# Patient Record
Sex: Female | Born: 1935 | Race: Black or African American | Hispanic: No | Smoking: Never smoker
Health system: Southern US, Community
[De-identification: ages and names within clinical notes are randomized; demographics above are authoritative.]

## PROBLEM LIST (undated history)

## (undated) DIAGNOSIS — IMO0002 Reserved for concepts with insufficient information to code with codable children: Secondary | ICD-10-CM

## (undated) DIAGNOSIS — I10 Essential (primary) hypertension: Secondary | ICD-10-CM

## (undated) DIAGNOSIS — M1990 Unspecified osteoarthritis, unspecified site: Secondary | ICD-10-CM

## (undated) DIAGNOSIS — M329 Systemic lupus erythematosus, unspecified: Secondary | ICD-10-CM

## (undated) DIAGNOSIS — M810 Age-related osteoporosis without current pathological fracture: Secondary | ICD-10-CM

## (undated) DIAGNOSIS — Z5189 Encounter for other specified aftercare: Secondary | ICD-10-CM

## (undated) DIAGNOSIS — F0390 Unspecified dementia without behavioral disturbance: Secondary | ICD-10-CM

## (undated) HISTORY — PX: VASCULAR SURGERY: SHX849

## (undated) HISTORY — PX: BACK SURGERY: SHX140

---

## 2002-04-30 ENCOUNTER — Encounter: Payer: Self-pay | Admitting: Unknown Physician Specialty

## 2002-04-30 ENCOUNTER — Ambulatory Visit (HOSPITAL_COMMUNITY): Admission: RE | Admit: 2002-04-30 | Discharge: 2002-04-30 | Payer: Self-pay | Admitting: Unknown Physician Specialty

## 2002-06-25 ENCOUNTER — Ambulatory Visit (HOSPITAL_COMMUNITY): Admission: RE | Admit: 2002-06-25 | Discharge: 2002-06-25 | Payer: Self-pay | Admitting: Unknown Physician Specialty

## 2002-06-25 ENCOUNTER — Encounter: Payer: Self-pay | Admitting: Unknown Physician Specialty

## 2002-08-30 ENCOUNTER — Encounter: Payer: Self-pay | Admitting: Unknown Physician Specialty

## 2002-08-30 ENCOUNTER — Ambulatory Visit (HOSPITAL_COMMUNITY): Admission: RE | Admit: 2002-08-30 | Discharge: 2002-08-30 | Payer: Self-pay | Admitting: Unknown Physician Specialty

## 2002-09-07 ENCOUNTER — Encounter (HOSPITAL_BASED_OUTPATIENT_CLINIC_OR_DEPARTMENT_OTHER): Admission: RE | Admit: 2002-09-07 | Discharge: 2002-12-06 | Payer: Self-pay | Admitting: Internal Medicine

## 2002-11-17 ENCOUNTER — Encounter: Payer: Self-pay | Admitting: Unknown Physician Specialty

## 2002-11-17 ENCOUNTER — Encounter: Admission: RE | Admit: 2002-11-17 | Discharge: 2002-11-17 | Payer: Self-pay | Admitting: Unknown Physician Specialty

## 2003-01-05 ENCOUNTER — Emergency Department (HOSPITAL_COMMUNITY): Admission: EM | Admit: 2003-01-05 | Discharge: 2003-01-05 | Payer: Self-pay | Admitting: *Deleted

## 2003-05-03 ENCOUNTER — Ambulatory Visit (HOSPITAL_COMMUNITY): Admission: RE | Admit: 2003-05-03 | Discharge: 2003-05-03 | Payer: Self-pay | Admitting: Unknown Physician Specialty

## 2003-05-03 ENCOUNTER — Encounter: Payer: Self-pay | Admitting: Unknown Physician Specialty

## 2003-05-18 ENCOUNTER — Ambulatory Visit (HOSPITAL_COMMUNITY): Admission: RE | Admit: 2003-05-18 | Discharge: 2003-05-18 | Payer: Self-pay | Admitting: Unknown Physician Specialty

## 2003-05-18 ENCOUNTER — Encounter: Payer: Self-pay | Admitting: Unknown Physician Specialty

## 2003-06-01 ENCOUNTER — Ambulatory Visit (HOSPITAL_COMMUNITY): Admission: RE | Admit: 2003-06-01 | Discharge: 2003-06-01 | Payer: Self-pay | Admitting: Unknown Physician Specialty

## 2003-06-01 ENCOUNTER — Encounter: Payer: Self-pay | Admitting: Unknown Physician Specialty

## 2003-08-10 ENCOUNTER — Encounter: Admission: RE | Admit: 2003-08-10 | Discharge: 2003-08-10 | Payer: Self-pay | Admitting: Neurosurgery

## 2003-08-26 ENCOUNTER — Encounter: Admission: RE | Admit: 2003-08-26 | Discharge: 2003-08-26 | Payer: Self-pay | Admitting: Neurosurgery

## 2007-01-17 ENCOUNTER — Inpatient Hospital Stay (HOSPITAL_COMMUNITY): Admission: EM | Admit: 2007-01-17 | Discharge: 2007-01-18 | Payer: Self-pay | Admitting: Emergency Medicine

## 2010-02-06 ENCOUNTER — Ambulatory Visit: Payer: Self-pay | Admitting: Emergency Medicine

## 2010-02-06 ENCOUNTER — Inpatient Hospital Stay (HOSPITAL_COMMUNITY): Admission: RE | Admit: 2010-02-06 | Discharge: 2010-03-29 | Payer: Self-pay | Admitting: Neurosurgery

## 2010-02-08 ENCOUNTER — Ambulatory Visit: Payer: Self-pay | Admitting: Physical Medicine & Rehabilitation

## 2010-02-16 ENCOUNTER — Encounter (INDEPENDENT_AMBULATORY_CARE_PROVIDER_SITE_OTHER): Payer: Self-pay | Admitting: Internal Medicine

## 2010-02-16 ENCOUNTER — Ambulatory Visit: Payer: Self-pay | Admitting: Vascular Surgery

## 2010-03-01 ENCOUNTER — Ambulatory Visit: Payer: Self-pay | Admitting: Internal Medicine

## 2010-03-17 ENCOUNTER — Ambulatory Visit: Payer: Self-pay | Admitting: Internal Medicine

## 2010-06-07 ENCOUNTER — Encounter
Admission: RE | Admit: 2010-06-07 | Discharge: 2010-09-05 | Payer: Self-pay | Source: Home / Self Care | Attending: Neurosurgery | Admitting: Neurosurgery

## 2010-08-21 IMAGING — CR DG CHEST 1V PORT
1 series · 1 of 1 positions shown · non-contrast
Comparison: 02/16/2010

CLINICAL DATA: Assess PICC line placement.

PORTABLE CHEST - 1 VIEW

[view not recorded]
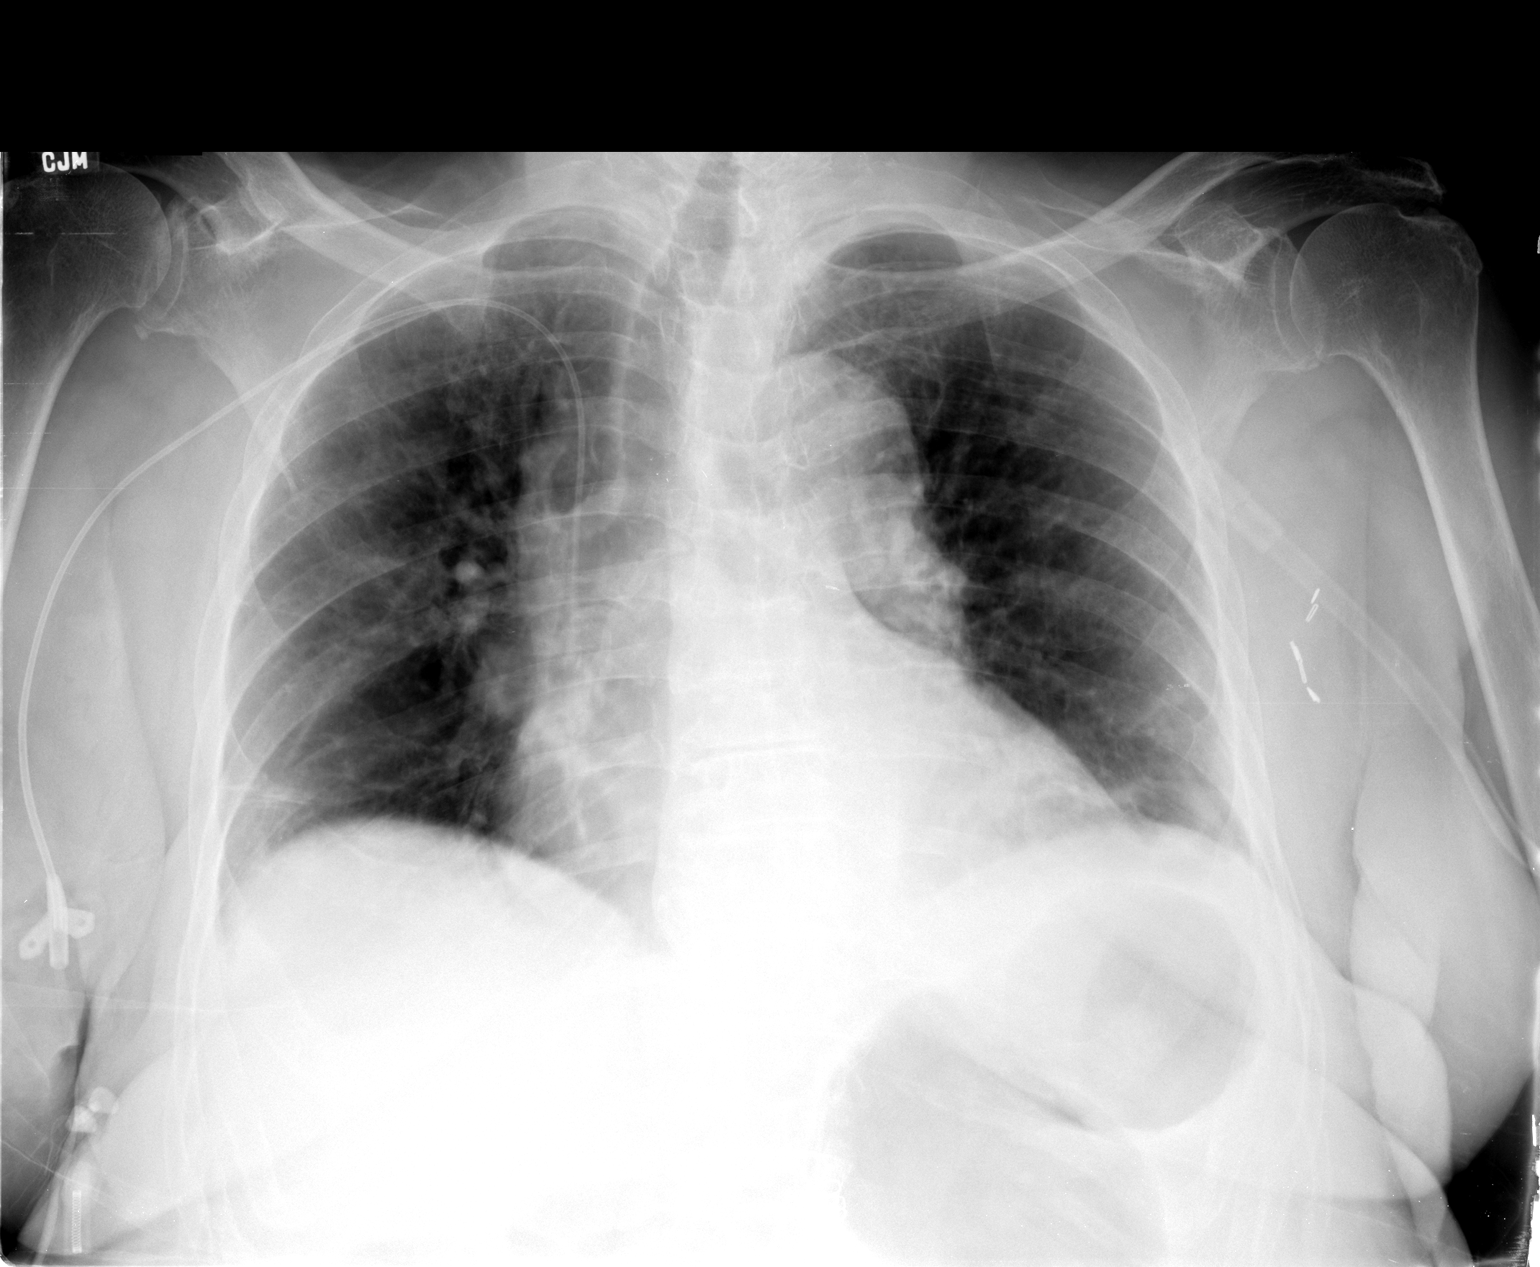

[1 of 1 positions shown; findings below may reference images not displayed]

FINDINGS: PICC line has been inserted via right upper extremity
approach.  The catheter tip is in the lower SVC.  Minimal
atelectasis at the bases.
IMPRESSION: The PICC line tip is in the lower SVC.

## 2010-08-23 IMAGING — CR DG ABDOMEN 2V
2 series · 2 of 2 positions shown · non-contrast
Comparison: 03/01/2010.

CLINICAL DATA: Abdominal distension.  Clostridium difficile.

ABDOMEN - 2 VIEW

[w abdomen decub *]
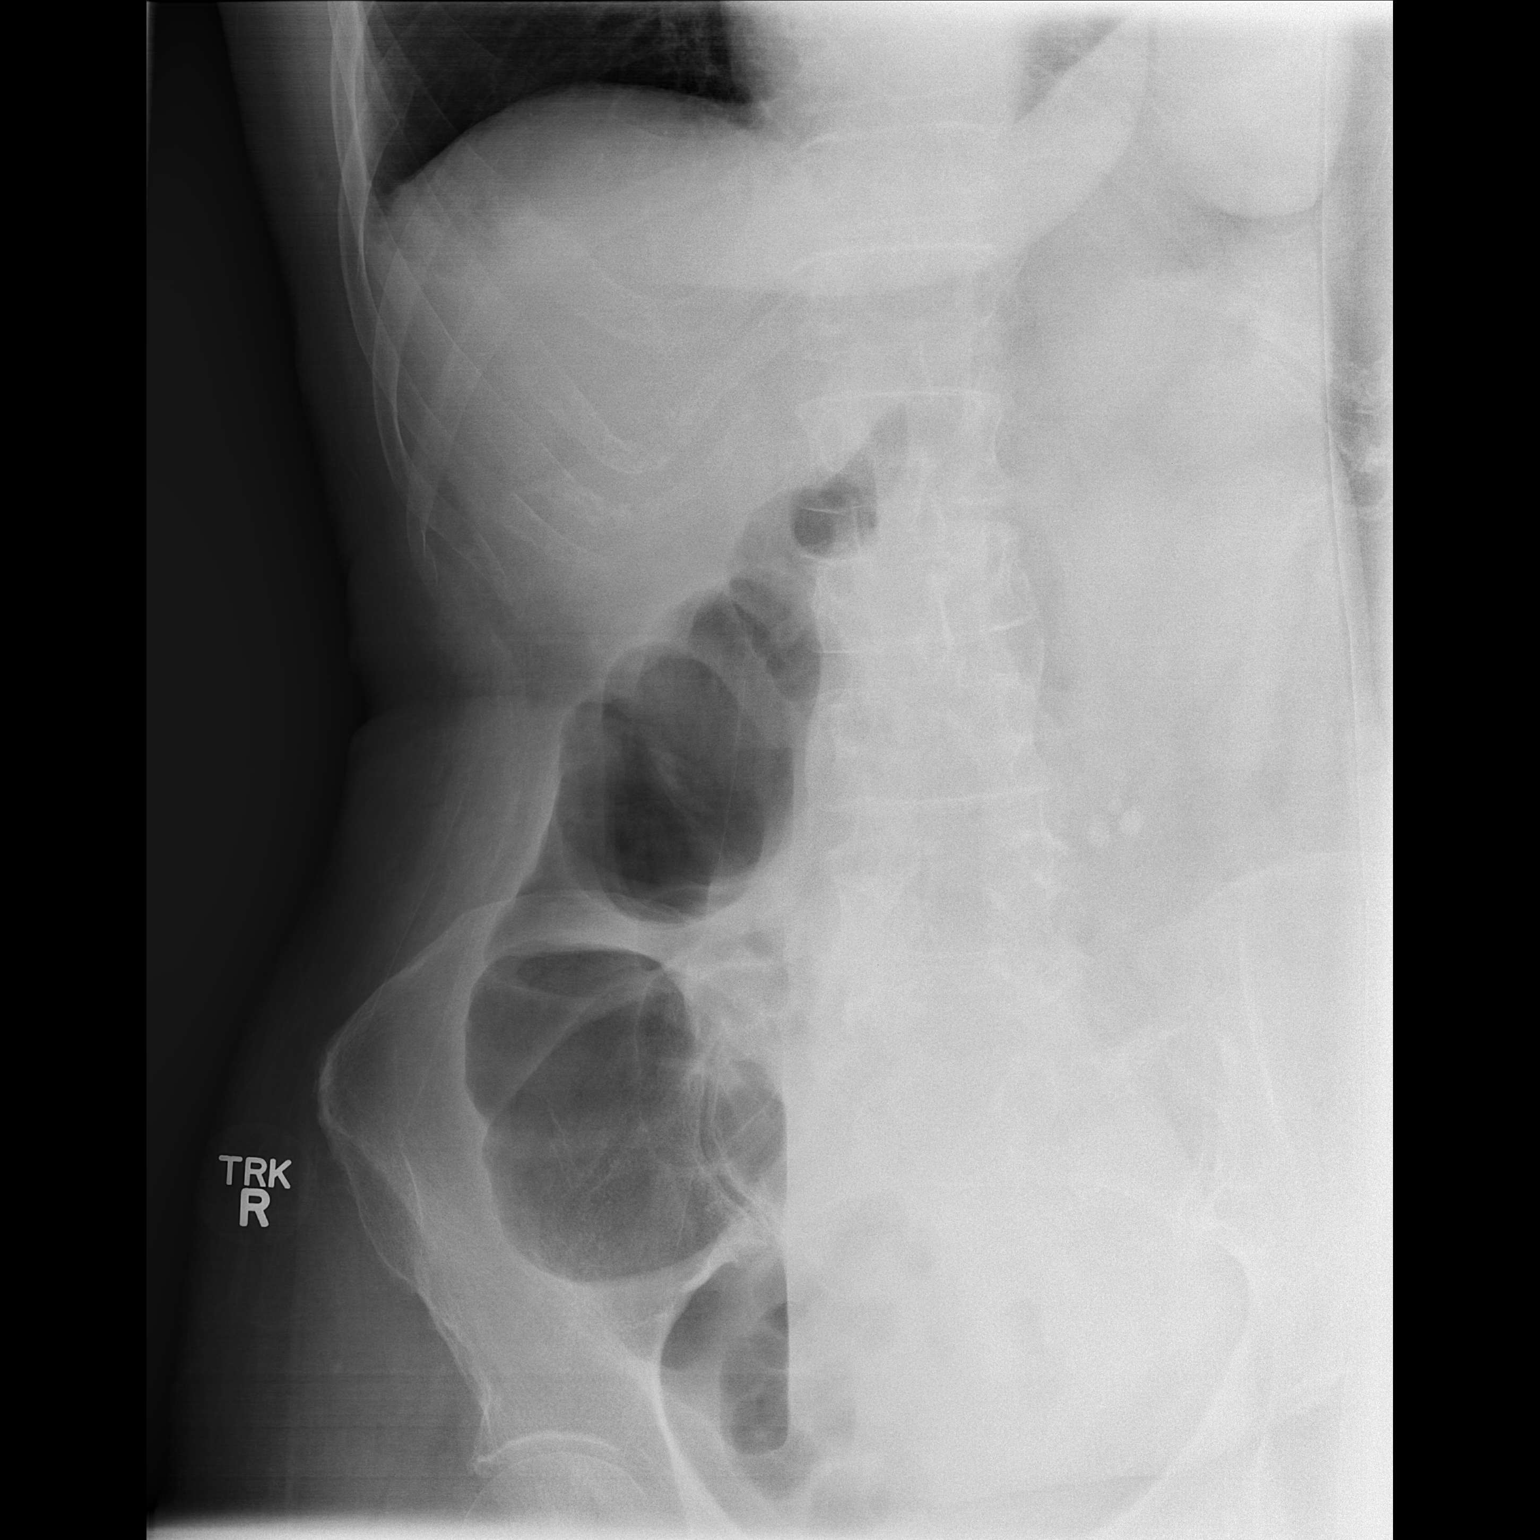

[t abdomen supine *]
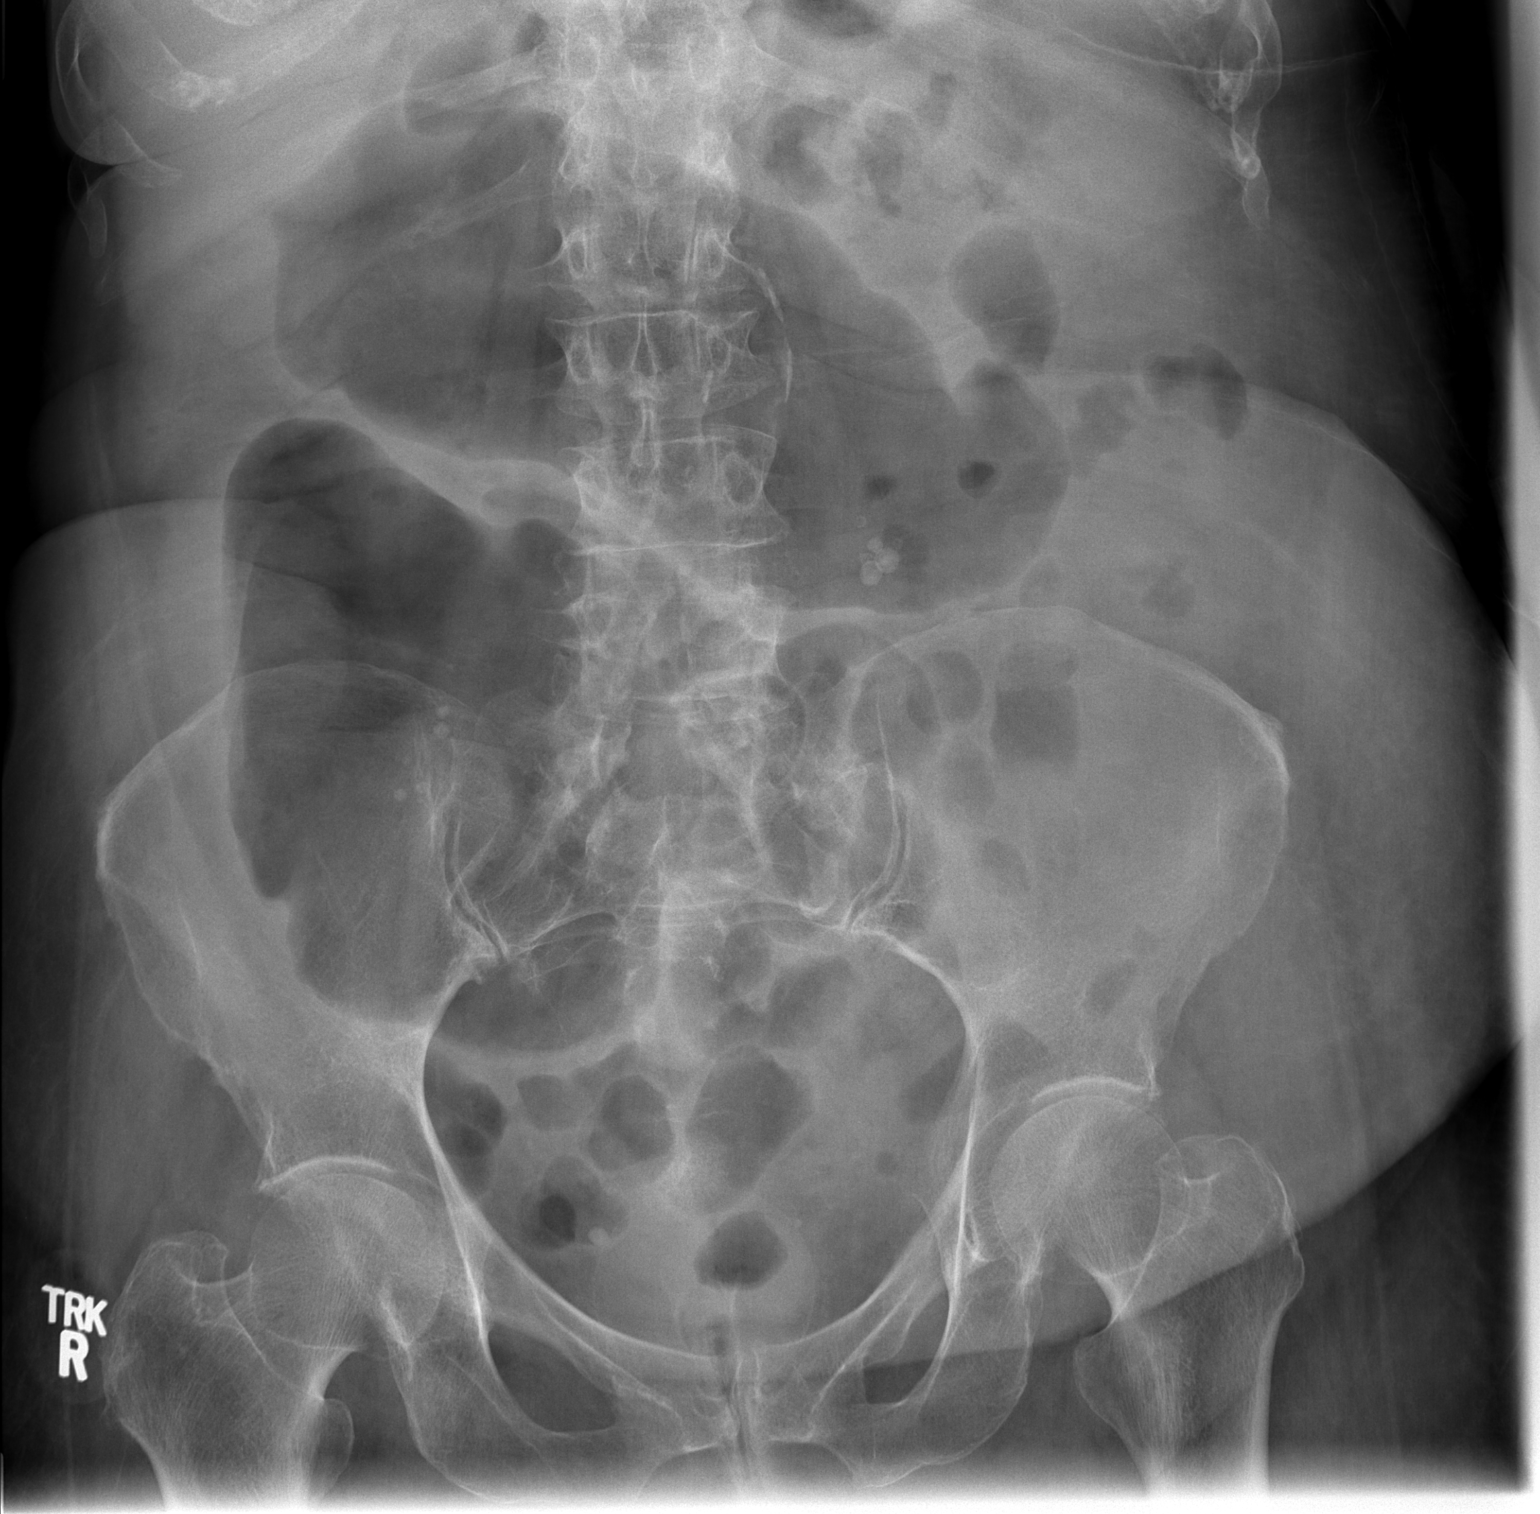

[2 of 2 positions shown; findings below may reference images not displayed]

FINDINGS: Diffuse colonic distension has improved compared with the
prior study.  There is no apparent bowel wall thickening,
pneumatosis or free intraperitoneal air.  Skin staples have been
removed.  Left mid abdominal calcifications are unchanged,
corresponding with phleboliths on prior CT.
IMPRESSION: Improved colonic distension.  No evidence of pneumoperitoneum.

## 2010-09-08 IMAGING — CR DG ABD PORTABLE 1V
1 series · 1 of 1 positions shown · non-contrast
Comparison: 03/07/2010

CLINICAL DATA: Colitis.

ABDOMEN - 1 VIEW

[view not recorded]
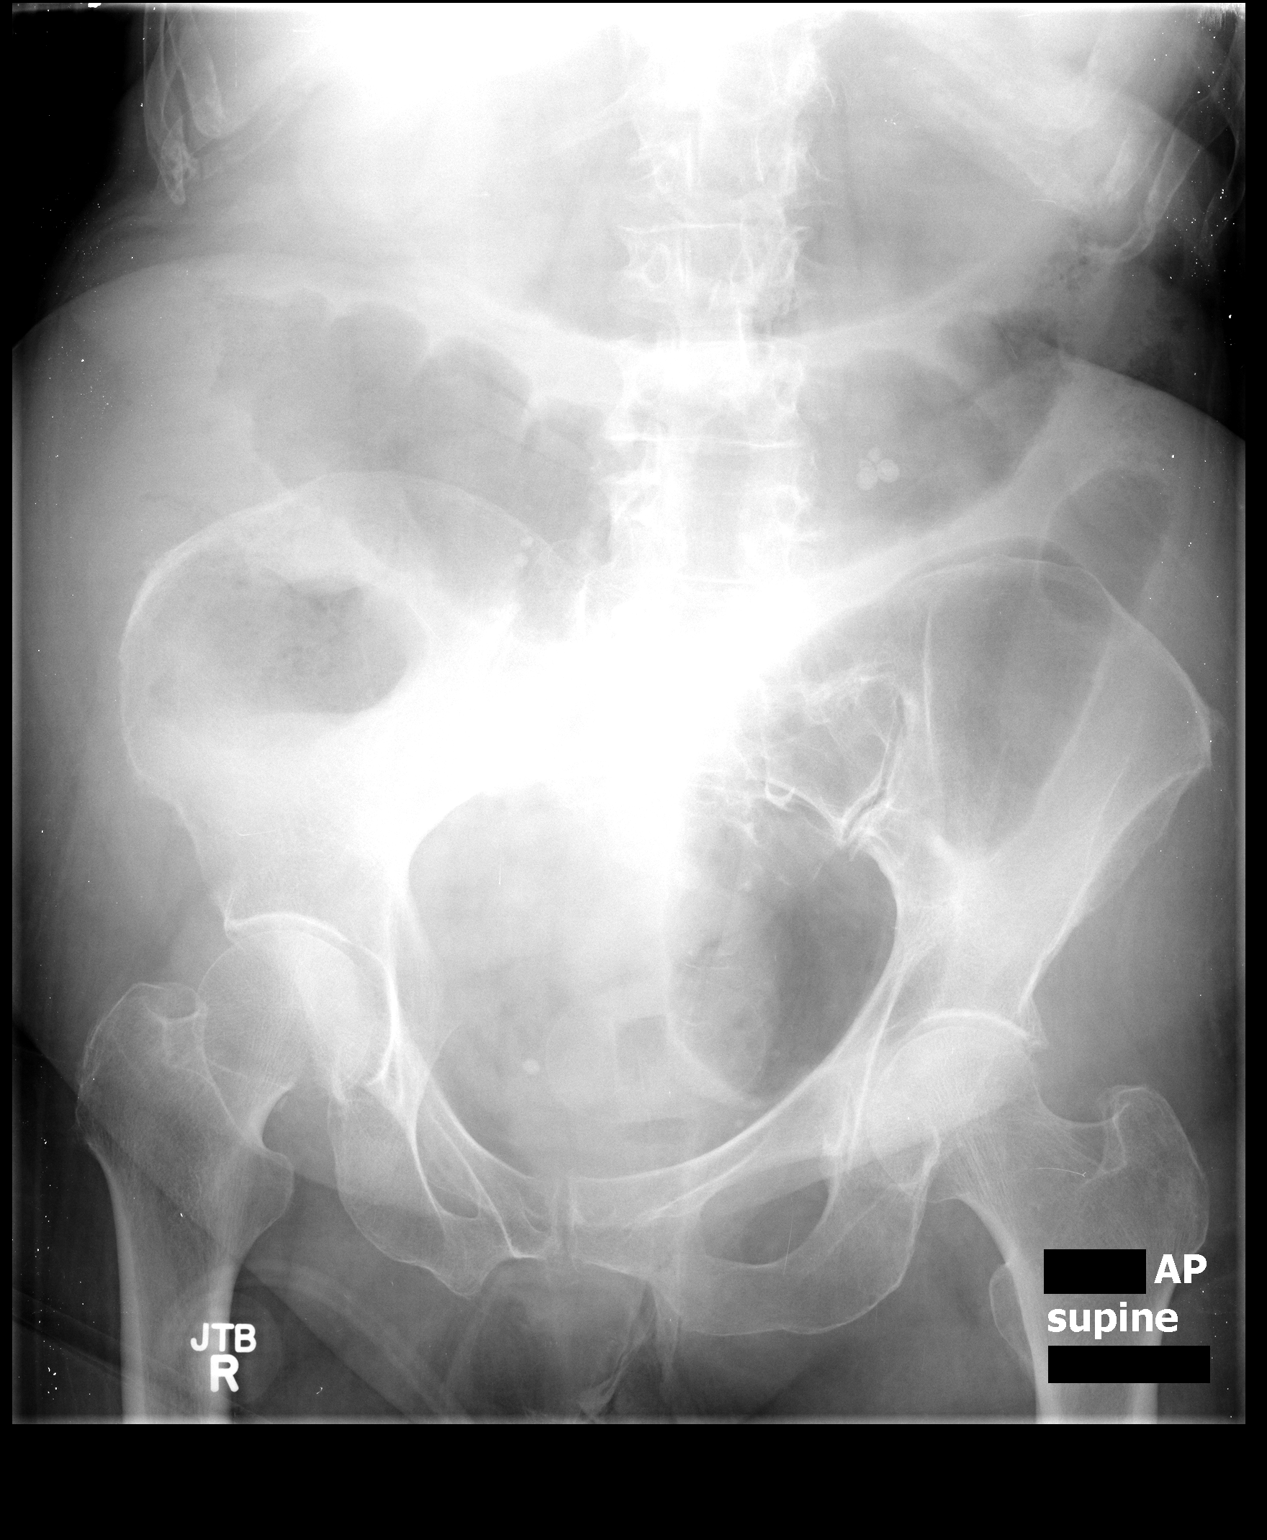

[1 of 1 positions shown; findings below may reference images not displayed]

FINDINGS: There is slight gaseous distention of the stomach.  There
is also a moderate amount of air in the transverse portion of the
colon and in the descending colon.  The there are no dilated loops
of small bowel.  Calcifications are noted in the ovarian veins
bilaterally.
IMPRESSION: Slight decrease in the gaseous distention of the colon since the
prior study.  The patient now has gaseous distention of the
stomach.

## 2010-12-02 LAB — BASIC METABOLIC PANEL
BUN: 46 mg/dL — ABNORMAL HIGH (ref 6–23)
BUN: 47 mg/dL — ABNORMAL HIGH (ref 6–23)
BUN: 48 mg/dL — ABNORMAL HIGH (ref 6–23)
BUN: 10 mg/dL (ref 6–23)
BUN: 12 mg/dL (ref 6–23)
BUN: 12 mg/dL (ref 6–23)
BUN: 13 mg/dL (ref 6–23)
BUN: 13 mg/dL (ref 6–23)
BUN: 14 mg/dL (ref 6–23)
BUN: 14 mg/dL (ref 6–23)
BUN: 18 mg/dL (ref 6–23)
BUN: 18 mg/dL (ref 6–23)
BUN: 21 mg/dL (ref 6–23)
BUN: 22 mg/dL (ref 6–23)
CO2: 17 mEq/L — ABNORMAL LOW (ref 19–32)
CO2: 22 mEq/L (ref 19–32)
CO2: 22 mEq/L (ref 19–32)
CO2: 22 mEq/L (ref 19–32)
CO2: 23 mEq/L (ref 19–32)
CO2: 24 mEq/L (ref 19–32)
CO2: 24 mEq/L (ref 19–32)
CO2: 25 mEq/L (ref 19–32)
CO2: 25 mEq/L (ref 19–32)
CO2: 26 mEq/L (ref 19–32)
CO2: 26 mEq/L (ref 19–32)
CO2: 26 mEq/L (ref 19–32)
CO2: 28 mEq/L (ref 19–32)
Calcium: 7.9 mg/dL — ABNORMAL LOW (ref 8.4–10.5)
Calcium: 8 mg/dL — ABNORMAL LOW (ref 8.4–10.5)
Calcium: 8.2 mg/dL — ABNORMAL LOW (ref 8.4–10.5)
Calcium: 8.5 mg/dL (ref 8.4–10.5)
Calcium: 9.7 mg/dL (ref 8.4–10.5)
Calcium: 7.9 mg/dL — ABNORMAL LOW (ref 8.4–10.5)
Calcium: 8.2 mg/dL — ABNORMAL LOW (ref 8.4–10.5)
Calcium: 8.2 mg/dL — ABNORMAL LOW (ref 8.4–10.5)
Calcium: 8.3 mg/dL — ABNORMAL LOW (ref 8.4–10.5)
Calcium: 8.3 mg/dL — ABNORMAL LOW (ref 8.4–10.5)
Calcium: 8.6 mg/dL (ref 8.4–10.5)
Chloride: 107 mEq/L (ref 96–112)
Chloride: 122 mEq/L — ABNORMAL HIGH (ref 96–112)
Chloride: >130 mEq/L (ref 96–112)
Chloride: 111 mEq/L (ref 96–112)
Chloride: 112 mEq/L (ref 96–112)
Chloride: 112 mEq/L (ref 96–112)
Chloride: 112 mEq/L (ref 96–112)
Chloride: 113 mEq/L — ABNORMAL HIGH (ref 96–112)
Chloride: 114 mEq/L — ABNORMAL HIGH (ref 96–112)
Chloride: 114 mEq/L — ABNORMAL HIGH (ref 96–112)
Chloride: 114 mEq/L — ABNORMAL HIGH (ref 96–112)
Chloride: 117 mEq/L — ABNORMAL HIGH (ref 96–112)
Creatinine, Ser: 0.96 mg/dL (ref 0.4–1.2)
Creatinine, Ser: 1.41 mg/dL — ABNORMAL HIGH (ref 0.4–1.2)
Creatinine, Ser: 0.86 mg/dL (ref 0.4–1.2)
Creatinine, Ser: 0.88 mg/dL (ref 0.4–1.2)
Creatinine, Ser: 0.89 mg/dL (ref 0.4–1.2)
Creatinine, Ser: 0.9 mg/dL (ref 0.4–1.2)
Creatinine, Ser: 0.93 mg/dL (ref 0.4–1.2)
Creatinine, Ser: 0.93 mg/dL (ref 0.4–1.2)
Creatinine, Ser: 0.98 mg/dL (ref 0.4–1.2)
Creatinine, Ser: 1 mg/dL (ref 0.4–1.2)
Creatinine, Ser: 1 mg/dL (ref 0.4–1.2)
Creatinine, Ser: 1.07 mg/dL (ref 0.4–1.2)
GFR calc Af Amer: 34 mL/min — ABNORMAL LOW (ref >60)
GFR calc Af Amer: 38 mL/min — ABNORMAL LOW (ref >60)
GFR calc Af Amer: 40 mL/min — ABNORMAL LOW (ref >60)
GFR calc Af Amer: 44 mL/min — ABNORMAL LOW (ref >60)
GFR calc Af Amer: 54 mL/min — ABNORMAL LOW (ref >60)
GFR calc Af Amer: >60 mL/min (ref >60)
GFR calc Af Amer: >60 mL/min (ref >60)
GFR calc Af Amer: >60 mL/min (ref >60)
GFR calc Af Amer: >60 mL/min (ref >60)
GFR calc Af Amer: >60 mL/min (ref >60)
GFR calc non Af Amer: 31 mL/min — ABNORMAL LOW (ref >60)
GFR calc non Af Amer: 32 mL/min — ABNORMAL LOW (ref >60)
GFR calc non Af Amer: 33 mL/min — ABNORMAL LOW (ref >60)
GFR calc non Af Amer: 42 mL/min — ABNORMAL LOW (ref >60)
GFR calc non Af Amer: 45 mL/min — ABNORMAL LOW (ref >60)
GFR calc non Af Amer: 57 mL/min — ABNORMAL LOW (ref >60)
GFR calc non Af Amer: 54 mL/min — ABNORMAL LOW (ref >60)
GFR calc non Af Amer: 58 mL/min — ABNORMAL LOW (ref >60)
GFR calc non Af Amer: >60 mL/min (ref >60)
GFR calc non Af Amer: >60 mL/min (ref >60)
GFR calc non Af Amer: >60 mL/min (ref >60)
GFR calc non Af Amer: >60 mL/min (ref >60)
GFR calc non Af Amer: >60 mL/min (ref >60)
Glucose, Bld: 116 mg/dL — ABNORMAL HIGH (ref 70–99)
Glucose, Bld: 132 mg/dL — ABNORMAL HIGH (ref 70–99)
Glucose, Bld: 225 mg/dL — ABNORMAL HIGH (ref 70–99)
Glucose, Bld: 102 mg/dL — ABNORMAL HIGH (ref 70–99)
Glucose, Bld: 108 mg/dL — ABNORMAL HIGH (ref 70–99)
Glucose, Bld: 115 mg/dL — ABNORMAL HIGH (ref 70–99)
Glucose, Bld: 116 mg/dL — ABNORMAL HIGH (ref 70–99)
Glucose, Bld: 116 mg/dL — ABNORMAL HIGH (ref 70–99)
Glucose, Bld: 120 mg/dL — ABNORMAL HIGH (ref 70–99)
Glucose, Bld: 121 mg/dL — ABNORMAL HIGH (ref 70–99)
Glucose, Bld: 123 mg/dL — ABNORMAL HIGH (ref 70–99)
Glucose, Bld: 127 mg/dL — ABNORMAL HIGH (ref 70–99)
Glucose, Bld: 129 mg/dL — ABNORMAL HIGH (ref 70–99)
Glucose, Bld: 190 mg/dL — ABNORMAL HIGH (ref 70–99)
Glucose, Bld: 98 mg/dL (ref 70–99)
Glucose, Bld: 99 mg/dL (ref 70–99)
Potassium: 3 mEq/L — ABNORMAL LOW (ref 3.5–5.1)
Potassium: 3.1 mEq/L — ABNORMAL LOW (ref 3.5–5.1)
Potassium: 3.8 mEq/L (ref 3.5–5.1)
Potassium: 4.8 mEq/L (ref 3.5–5.1)
Potassium: 6.6 mEq/L (ref 3.5–5.1)
Potassium: 2.6 mEq/L — CL (ref 3.5–5.1)
Potassium: 2.8 mEq/L — ABNORMAL LOW (ref 3.5–5.1)
Potassium: 2.8 mEq/L — ABNORMAL LOW (ref 3.5–5.1)
Potassium: 2.8 mEq/L — ABNORMAL LOW (ref 3.5–5.1)
Potassium: 3 mEq/L — ABNORMAL LOW (ref 3.5–5.1)
Potassium: 3.2 mEq/L — ABNORMAL LOW (ref 3.5–5.1)
Potassium: 3.3 mEq/L — ABNORMAL LOW (ref 3.5–5.1)
Potassium: 3.9 mEq/L (ref 3.5–5.1)
Sodium: 141 mEq/L (ref 135–145)
Sodium: 142 mEq/L (ref 135–145)
Sodium: 142 mEq/L (ref 135–145)
Sodium: 145 mEq/L (ref 135–145)
Sodium: 148 mEq/L — ABNORMAL HIGH (ref 135–145)
Sodium: 152 mEq/L — ABNORMAL HIGH (ref 135–145)
Sodium: 155 mEq/L — ABNORMAL HIGH (ref 135–145)
Sodium: 160 mEq/L — ABNORMAL HIGH (ref 135–145)
Sodium: 141 mEq/L (ref 135–145)
Sodium: 142 mEq/L (ref 135–145)
Sodium: 142 mEq/L (ref 135–145)
Sodium: 143 mEq/L (ref 135–145)
Sodium: 143 mEq/L (ref 135–145)
Sodium: 144 mEq/L (ref 135–145)
Sodium: 146 mEq/L — ABNORMAL HIGH (ref 135–145)

## 2010-12-02 LAB — CLOSTRIDIUM DIFFICILE EIA
C difficile Toxins A+B, EIA: NEGATIVE
C difficile Toxins A+B, EIA: NEGATIVE

## 2010-12-02 LAB — CBC
HCT: 24.5 % — ABNORMAL LOW (ref 36.0–46.0)
HCT: 24.5 % — ABNORMAL LOW (ref 36.0–46.0)
HCT: 27.3 % — ABNORMAL LOW (ref 36.0–46.0)
HCT: 30 % — ABNORMAL LOW (ref 36.0–46.0)
HCT: 30.8 % — ABNORMAL LOW (ref 36.0–46.0)
HCT: 31.3 % — ABNORMAL LOW (ref 36.0–46.0)
HCT: 32.6 % — ABNORMAL LOW (ref 36.0–46.0)
HCT: 34.1 % — ABNORMAL LOW (ref 36.0–46.0)
HCT: 22.3 % — ABNORMAL LOW (ref 36.0–46.0)
HCT: 22.7 % — ABNORMAL LOW (ref 36.0–46.0)
HCT: 23.2 % — ABNORMAL LOW (ref 36.0–46.0)
HCT: 23.7 % — ABNORMAL LOW (ref 36.0–46.0)
HCT: 23.8 % — ABNORMAL LOW (ref 36.0–46.0)
HCT: 25.7 % — ABNORMAL LOW (ref 36.0–46.0)
HCT: 27.9 % — ABNORMAL LOW (ref 36.0–46.0)
HCT: 28.1 % — ABNORMAL LOW (ref 36.0–46.0)
HCT: 28.1 % — ABNORMAL LOW (ref 36.0–46.0)
HCT: 28.2 % — ABNORMAL LOW (ref 36.0–46.0)
HCT: 28.8 % — ABNORMAL LOW (ref 36.0–46.0)
Hemoglobin: 10.3 g/dL — ABNORMAL LOW (ref 12.0–15.0)
Hemoglobin: 10.5 g/dL — ABNORMAL LOW (ref 12.0–15.0)
Hemoglobin: 10.6 g/dL — ABNORMAL LOW (ref 12.0–15.0)
Hemoglobin: 10.8 g/dL — ABNORMAL LOW (ref 12.0–15.0)
Hemoglobin: 11.2 g/dL — ABNORMAL LOW (ref 12.0–15.0)
Hemoglobin: 8.1 g/dL — ABNORMAL LOW (ref 12.0–15.0)
Hemoglobin: 8.1 g/dL — ABNORMAL LOW (ref 12.0–15.0)
Hemoglobin: 9.2 g/dL — ABNORMAL LOW (ref 12.0–15.0)
Hemoglobin: 9.4 g/dL — ABNORMAL LOW (ref 12.0–15.0)
Hemoglobin: 9.8 g/dL — ABNORMAL LOW (ref 12.0–15.0)
Hemoglobin: 7.7 g/dL — ABNORMAL LOW (ref 12.0–15.0)
Hemoglobin: 7.8 g/dL — ABNORMAL LOW (ref 12.0–15.0)
Hemoglobin: 7.9 g/dL — ABNORMAL LOW (ref 12.0–15.0)
Hemoglobin: 8.4 g/dL — ABNORMAL LOW (ref 12.0–15.0)
Hemoglobin: 8.6 g/dL — ABNORMAL LOW (ref 12.0–15.0)
Hemoglobin: 9.4 g/dL — ABNORMAL LOW (ref 12.0–15.0)
Hemoglobin: 9.4 g/dL — ABNORMAL LOW (ref 12.0–15.0)
Hemoglobin: 9.5 g/dL — ABNORMAL LOW (ref 12.0–15.0)
Hemoglobin: 9.5 g/dL — ABNORMAL LOW (ref 12.0–15.0)
MCH: 32.4 pg (ref 26.0–34.0)
MCH: 32.5 pg (ref 26.0–34.0)
MCH: 32.8 pg (ref 26.0–34.0)
MCH: 32.9 pg (ref 26.0–34.0)
MCH: 33 pg (ref 26.0–34.0)
MCH: 33 pg (ref 26.0–34.0)
MCH: 33.2 pg (ref 26.0–34.0)
MCH: 33.2 pg (ref 26.0–34.0)
MCHC: 32.2 g/dL (ref 30.0–36.0)
MCHC: 32.3 g/dL (ref 30.0–36.0)
MCHC: 32.4 g/dL (ref 30.0–36.0)
MCHC: 32.9 g/dL (ref 30.0–36.0)
MCHC: 33 g/dL (ref 30.0–36.0)
MCHC: 33.5 g/dL (ref 30.0–36.0)
MCHC: 32.8 g/dL (ref 30.0–36.0)
MCHC: 32.9 g/dL (ref 30.0–36.0)
MCHC: 33 g/dL (ref 30.0–36.0)
MCHC: 33 g/dL (ref 30.0–36.0)
MCHC: 33.1 g/dL (ref 30.0–36.0)
MCHC: 33.1 g/dL (ref 30.0–36.0)
MCHC: 33.3 g/dL (ref 30.0–36.0)
MCHC: 33.4 g/dL (ref 30.0–36.0)
MCHC: 33.6 g/dL (ref 30.0–36.0)
MCHC: 33.6 g/dL (ref 30.0–36.0)
MCHC: 33.7 g/dL (ref 30.0–36.0)
MCHC: 33.7 g/dL (ref 30.0–36.0)
MCHC: 33.8 g/dL (ref 30.0–36.0)
MCHC: 34.3 g/dL (ref 30.0–36.0)
MCV: 100.4 fL — ABNORMAL HIGH (ref 78.0–100.0)
MCV: 100.8 fL — ABNORMAL HIGH (ref 78.0–100.0)
MCV: 99.2 fL (ref 78.0–100.0)
MCV: 99.3 fL (ref 78.0–100.0)
MCV: 100.3 fL — ABNORMAL HIGH (ref 78.0–100.0)
MCV: 100.8 fL — ABNORMAL HIGH (ref 78.0–100.0)
MCV: 97.2 fL (ref 78.0–100.0)
MCV: 97.5 fL (ref 78.0–100.0)
MCV: 97.6 fL (ref 78.0–100.0)
MCV: 97.6 fL (ref 78.0–100.0)
MCV: 98.7 fL (ref 78.0–100.0)
MCV: 98.9 fL (ref 78.0–100.0)
MCV: 99.2 fL (ref 78.0–100.0)
MCV: 99.9 fL (ref 78.0–100.0)
Platelets: 171 10*3/uL (ref 150–400)
Platelets: 183 10*3/uL (ref 150–400)
Platelets: 293 10*3/uL (ref 150–400)
Platelets: 297 10*3/uL (ref 150–400)
Platelets: 387 10*3/uL (ref 150–400)
Platelets: 172 10*3/uL (ref 150–400)
Platelets: 193 10*3/uL (ref 150–400)
Platelets: 203 10*3/uL (ref 150–400)
Platelets: 230 10*3/uL (ref 150–400)
Platelets: 258 10*3/uL (ref 150–400)
Platelets: 267 10*3/uL (ref 150–400)
Platelets: 305 10*3/uL (ref 150–400)
RBC: 2.68 MIL/uL — ABNORMAL LOW (ref 3.87–5.11)
RBC: 2.73 MIL/uL — ABNORMAL LOW (ref 3.87–5.11)
RBC: 2.75 MIL/uL — ABNORMAL LOW (ref 3.87–5.11)
RBC: 3.01 MIL/uL — ABNORMAL LOW (ref 3.87–5.11)
RBC: 3.16 MIL/uL — ABNORMAL LOW (ref 3.87–5.11)
RBC: 3.22 MIL/uL — ABNORMAL LOW (ref 3.87–5.11)
RBC: 3.29 MIL/uL — ABNORMAL LOW (ref 3.87–5.11)
RBC: 3.34 MIL/uL — ABNORMAL LOW (ref 3.87–5.11)
RBC: 2.24 MIL/uL — ABNORMAL LOW (ref 3.87–5.11)
RBC: 2.62 MIL/uL — ABNORMAL LOW (ref 3.87–5.11)
RBC: 2.88 MIL/uL — ABNORMAL LOW (ref 3.87–5.11)
RBC: 2.89 MIL/uL — ABNORMAL LOW (ref 3.87–5.11)
RDW: 19.3 % — ABNORMAL HIGH (ref 11.5–15.5)
RDW: 19.9 % — ABNORMAL HIGH (ref 11.5–15.5)
RDW: 20.8 % — ABNORMAL HIGH (ref 11.5–15.5)
RDW: 21.5 % — ABNORMAL HIGH (ref 11.5–15.5)
RDW: 22 % — ABNORMAL HIGH (ref 11.5–15.5)
RDW: 22.1 % — ABNORMAL HIGH (ref 11.5–15.5)
RDW: 22.6 % — ABNORMAL HIGH (ref 11.5–15.5)
RDW: 22.7 % — ABNORMAL HIGH (ref 11.5–15.5)
RDW: 22.8 % — ABNORMAL HIGH (ref 11.5–15.5)
RDW: 20.1 % — ABNORMAL HIGH (ref 11.5–15.5)
RDW: 20.5 % — ABNORMAL HIGH (ref 11.5–15.5)
RDW: 20.5 % — ABNORMAL HIGH (ref 11.5–15.5)
RDW: 21.5 % — ABNORMAL HIGH (ref 11.5–15.5)
RDW: 21.7 % — ABNORMAL HIGH (ref 11.5–15.5)
RDW: 22.4 % — ABNORMAL HIGH (ref 11.5–15.5)
RDW: 22.9 % — ABNORMAL HIGH (ref 11.5–15.5)
RDW: 23.4 % — ABNORMAL HIGH (ref 11.5–15.5)
RDW: 23.4 % — ABNORMAL HIGH (ref 11.5–15.5)
WBC: 10.2 10*3/uL (ref 4.0–10.5)
WBC: 10.5 10*3/uL (ref 4.0–10.5)
WBC: 10.9 10*3/uL — ABNORMAL HIGH (ref 4.0–10.5)
WBC: 11 10*3/uL — ABNORMAL HIGH (ref 4.0–10.5)
WBC: 11.3 10*3/uL — ABNORMAL HIGH (ref 4.0–10.5)
WBC: 11.8 10*3/uL — ABNORMAL HIGH (ref 4.0–10.5)
WBC: 14.2 10*3/uL — ABNORMAL HIGH (ref 4.0–10.5)
WBC: 15.8 10*3/uL — ABNORMAL HIGH (ref 4.0–10.5)
WBC: 8.1 10*3/uL (ref 4.0–10.5)
WBC: 8.4 10*3/uL (ref 4.0–10.5)
WBC: 8.6 10*3/uL (ref 4.0–10.5)
WBC: 11.1 10*3/uL — ABNORMAL HIGH (ref 4.0–10.5)
WBC: 13.1 10*3/uL — ABNORMAL HIGH (ref 4.0–10.5)
WBC: 6.5 10*3/uL (ref 4.0–10.5)
WBC: 7.8 10*3/uL (ref 4.0–10.5)

## 2010-12-02 LAB — MAGNESIUM
Magnesium: 2 mg/dL (ref 1.5–2.5)
Magnesium: 2.1 mg/dL (ref 1.5–2.5)

## 2010-12-02 LAB — DIFFERENTIAL
Basophils Absolute: 0 10*3/uL (ref 0.0–0.1)
Basophils Absolute: 0 10*3/uL (ref 0.0–0.1)
Basophils Relative: 0 % (ref 0–1)
Basophils Relative: 0 % (ref 0–1)
Basophils Relative: 0 % (ref 0–1)
Eosinophils Absolute: 0.1 10*3/uL (ref 0.0–0.7)
Eosinophils Absolute: 0.1 10*3/uL (ref 0.0–0.7)
Eosinophils Absolute: 0.2 10*3/uL (ref 0.0–0.7)
Eosinophils Relative: 1 % (ref 0–5)
Eosinophils Relative: 1 % (ref 0–5)
Lymphocytes Relative: 7 % — ABNORMAL LOW (ref 12–46)
Lymphocytes Relative: 9 % — ABNORMAL LOW (ref 12–46)
Lymphocytes Relative: 9 % — ABNORMAL LOW (ref 12–46)
Lymphs Abs: 0.7 10*3/uL (ref 0.7–4.0)
Monocytes Absolute: 0.4 10*3/uL (ref 0.1–1.0)
Monocytes Absolute: 0.4 10*3/uL (ref 0.1–1.0)
Monocytes Absolute: 0.6 10*3/uL (ref 0.1–1.0)
Monocytes Relative: 5 % (ref 3–12)
Monocytes Relative: 7 % (ref 3–12)
Neutro Abs: 7.5 10*3/uL (ref 1.7–7.7)
Neutrophils Relative %: 87 % — ABNORMAL HIGH (ref 43–77)

## 2010-12-02 LAB — PROTIME-INR
INR: 1.89 — ABNORMAL HIGH (ref 0.00–1.49)
INR: 2.08 — ABNORMAL HIGH (ref 0.00–1.49)
INR: 2.38 — ABNORMAL HIGH (ref 0.00–1.49)
INR: 2.75 — ABNORMAL HIGH (ref 0.00–1.49)
INR: 3.97 — ABNORMAL HIGH (ref 0.00–1.49)
INR: 4.38 — ABNORMAL HIGH (ref 0.00–1.49)
INR: 1.71 — ABNORMAL HIGH (ref 0.00–1.49)
INR: 1.91 — ABNORMAL HIGH (ref 0.00–1.49)
INR: 2.07 — ABNORMAL HIGH (ref 0.00–1.49)
INR: 2.39 — ABNORMAL HIGH (ref 0.00–1.49)
INR: 2.81 — ABNORMAL HIGH (ref 0.00–1.49)
INR: 2.83 — ABNORMAL HIGH (ref 0.00–1.49)
INR: 3 — ABNORMAL HIGH (ref 0.00–1.49)
INR: 3.87 — ABNORMAL HIGH (ref 0.00–1.49)
Prothrombin Time: 21.4 seconds — ABNORMAL HIGH (ref 11.6–15.2)
Prothrombin Time: 23.2 seconds — ABNORMAL HIGH (ref 11.6–15.2)
Prothrombin Time: 23.5 seconds — ABNORMAL HIGH (ref 11.6–15.2)
Prothrombin Time: 24.7 seconds — ABNORMAL HIGH (ref 11.6–15.2)
Prothrombin Time: 27.3 seconds — ABNORMAL HIGH (ref 11.6–15.2)
Prothrombin Time: 28.9 seconds — ABNORMAL HIGH (ref 11.6–15.2)
Prothrombin Time: 41.5 seconds — ABNORMAL HIGH (ref 11.6–15.2)
Prothrombin Time: 21 seconds — ABNORMAL HIGH (ref 11.6–15.2)
Prothrombin Time: 29.4 seconds — ABNORMAL HIGH (ref 11.6–15.2)
Prothrombin Time: 29.7 seconds — ABNORMAL HIGH (ref 11.6–15.2)
Prothrombin Time: 30.9 seconds — ABNORMAL HIGH (ref 11.6–15.2)
Prothrombin Time: 33.9 seconds — ABNORMAL HIGH (ref 11.6–15.2)

## 2010-12-02 LAB — CROSSMATCH: ABO/RH(D): AB POS

## 2010-12-02 LAB — ALBUMIN: Albumin: 2.8 g/dL — ABNORMAL LOW (ref 3.5–5.2)

## 2010-12-02 LAB — WOUND CULTURE

## 2010-12-02 LAB — HEMOGLOBIN AND HEMATOCRIT, BLOOD: HCT: 25.7 % — ABNORMAL LOW (ref 36.0–46.0)

## 2010-12-03 LAB — CBC
HCT: 30.8 % — ABNORMAL LOW (ref 36.0–46.0)
HCT: 30.8 % — ABNORMAL LOW (ref 36.0–46.0)
HCT: 32.7 % — ABNORMAL LOW (ref 36.0–46.0)
HCT: 32.9 % — ABNORMAL LOW (ref 36.0–46.0)
HCT: 33.5 % — ABNORMAL LOW (ref 36.0–46.0)
HCT: 34.4 % — ABNORMAL LOW (ref 36.0–46.0)
HCT: 34.9 % — ABNORMAL LOW (ref 36.0–46.0)
HCT: 35.7 % — ABNORMAL LOW (ref 36.0–46.0)
HCT: 35.7 % — ABNORMAL LOW (ref 36.0–46.0)
HCT: 36.2 % (ref 36.0–46.0)
HCT: 30.5 % — ABNORMAL LOW (ref 36.0–46.0)
Hemoglobin: 10.2 g/dL — ABNORMAL LOW (ref 12.0–15.0)
Hemoglobin: 10.4 g/dL — ABNORMAL LOW (ref 12.0–15.0)
Hemoglobin: 11 g/dL — ABNORMAL LOW (ref 12.0–15.0)
Hemoglobin: 11.1 g/dL — ABNORMAL LOW (ref 12.0–15.0)
Hemoglobin: 11.2 g/dL — ABNORMAL LOW (ref 12.0–15.0)
Hemoglobin: 11.4 g/dL — ABNORMAL LOW (ref 12.0–15.0)
Hemoglobin: 11.6 g/dL — ABNORMAL LOW (ref 12.0–15.0)
Hemoglobin: 11.8 g/dL — ABNORMAL LOW (ref 12.0–15.0)
Hemoglobin: 12 g/dL (ref 12.0–15.0)
Hemoglobin: 9.8 g/dL — ABNORMAL LOW (ref 12.0–15.0)
Hemoglobin: 10.4 g/dL — ABNORMAL LOW (ref 12.0–15.0)
MCHC: 32.7 g/dL (ref 30.0–36.0)
MCHC: 32.9 g/dL (ref 30.0–36.0)
MCHC: 33.1 g/dL (ref 30.0–36.0)
MCHC: 33.5 g/dL (ref 30.0–36.0)
MCHC: 34 g/dL (ref 30.0–36.0)
MCHC: 34 g/dL (ref 30.0–36.0)
MCV: 95.7 fL (ref 78.0–100.0)
MCV: 95.9 fL (ref 78.0–100.0)
MCV: 96.1 fL (ref 78.0–100.0)
MCV: 96.5 fL (ref 78.0–100.0)
MCV: 96.5 fL (ref 78.0–100.0)
MCV: 96.8 fL (ref 78.0–100.0)
MCV: 96.8 fL (ref 78.0–100.0)
MCV: 96.8 fL (ref 78.0–100.0)
MCV: 95.7 fL (ref 78.0–100.0)
Platelets: 323 10*3/uL (ref 150–400)
Platelets: 341 10*3/uL (ref 150–400)
Platelets: 382 10*3/uL (ref 150–400)
Platelets: 413 10*3/uL — ABNORMAL HIGH (ref 150–400)
Platelets: 419 10*3/uL — ABNORMAL HIGH (ref 150–400)
Platelets: 437 10*3/uL — ABNORMAL HIGH (ref 150–400)
Platelets: 246 10*3/uL (ref 150–400)
Platelets: 276 10*3/uL (ref 150–400)
Platelets: 341 10*3/uL (ref 150–400)
RBC: 2.96 MIL/uL — ABNORMAL LOW (ref 3.87–5.11)
RBC: 3.1 MIL/uL — ABNORMAL LOW (ref 3.87–5.11)
RBC: 3.19 MIL/uL — ABNORMAL LOW (ref 3.87–5.11)
RBC: 3.67 MIL/uL — ABNORMAL LOW (ref 3.87–5.11)
RBC: 3.72 MIL/uL — ABNORMAL LOW (ref 3.87–5.11)
RBC: 3.75 MIL/uL — ABNORMAL LOW (ref 3.87–5.11)
RBC: 3.76 MIL/uL — ABNORMAL LOW (ref 3.87–5.11)
RDW: 16.9 % — ABNORMAL HIGH (ref 11.5–15.5)
RDW: 17.2 % — ABNORMAL HIGH (ref 11.5–15.5)
RDW: 17.3 % — ABNORMAL HIGH (ref 11.5–15.5)
RDW: 17.4 % — ABNORMAL HIGH (ref 11.5–15.5)
RDW: 18 % — ABNORMAL HIGH (ref 11.5–15.5)
RDW: 19 % — ABNORMAL HIGH (ref 11.5–15.5)
RDW: 19.2 % — ABNORMAL HIGH (ref 11.5–15.5)
RDW: 17.4 % — ABNORMAL HIGH (ref 11.5–15.5)
RDW: 17.4 % — ABNORMAL HIGH (ref 11.5–15.5)
WBC: 12.5 10*3/uL — ABNORMAL HIGH (ref 4.0–10.5)
WBC: 12.7 10*3/uL — ABNORMAL HIGH (ref 4.0–10.5)
WBC: 14.4 10*3/uL — ABNORMAL HIGH (ref 4.0–10.5)
WBC: 15.5 10*3/uL — ABNORMAL HIGH (ref 4.0–10.5)
WBC: 15.5 10*3/uL — ABNORMAL HIGH (ref 4.0–10.5)
WBC: 18.4 10*3/uL — ABNORMAL HIGH (ref 4.0–10.5)
WBC: 18.4 10*3/uL — ABNORMAL HIGH (ref 4.0–10.5)
WBC: 20 10*3/uL — ABNORMAL HIGH (ref 4.0–10.5)
WBC: 20.9 10*3/uL — ABNORMAL HIGH (ref 4.0–10.5)
WBC: 15.6 10*3/uL — ABNORMAL HIGH (ref 4.0–10.5)
WBC: 16.5 10*3/uL — ABNORMAL HIGH (ref 4.0–10.5)

## 2010-12-03 LAB — PROTIME-INR
INR: 1.24 (ref 0.00–1.49)
INR: 1.26 (ref 0.00–1.49)
INR: 1.34 (ref 0.00–1.49)
INR: 1.56 — ABNORMAL HIGH (ref 0.00–1.49)
Prothrombin Time: 15.5 seconds — ABNORMAL HIGH (ref 11.6–15.2)
Prothrombin Time: 15.7 seconds — ABNORMAL HIGH (ref 11.6–15.2)
Prothrombin Time: 21.7 seconds — ABNORMAL HIGH (ref 11.6–15.2)
Prothrombin Time: 29.4 seconds — ABNORMAL HIGH (ref 11.6–15.2)

## 2010-12-03 LAB — COMPREHENSIVE METABOLIC PANEL
ALT: 36 U/L — ABNORMAL HIGH (ref 0–35)
ALT: 37 U/L — ABNORMAL HIGH (ref 0–35)
AST: 52 U/L — ABNORMAL HIGH (ref 0–37)
Albumin: 2.2 g/dL — ABNORMAL LOW (ref 3.5–5.2)
Albumin: 2.4 g/dL — ABNORMAL LOW (ref 3.5–5.2)
Alkaline Phosphatase: 112 U/L (ref 39–117)
Alkaline Phosphatase: 98 U/L (ref 39–117)
BUN: 14 mg/dL (ref 6–23)
BUN: 45 mg/dL — ABNORMAL HIGH (ref 6–23)
CO2: 26 mEq/L (ref 19–32)
Calcium: 8.2 mg/dL — ABNORMAL LOW (ref 8.4–10.5)
Chloride: 105 mEq/L (ref 96–112)
Chloride: 117 mEq/L — ABNORMAL HIGH (ref 96–112)
Creatinine, Ser: 0.9 mg/dL (ref 0.4–1.2)
GFR calc Af Amer: >60 mL/min (ref >60)
GFR calc non Af Amer: >60 mL/min (ref >60)
Glucose, Bld: 126 mg/dL — ABNORMAL HIGH (ref 70–99)
Potassium: 3 mEq/L — ABNORMAL LOW (ref 3.5–5.1)
Potassium: 3.1 mEq/L — ABNORMAL LOW (ref 3.5–5.1)
Sodium: 141 mEq/L (ref 135–145)
Total Bilirubin: 0.6 mg/dL (ref 0.3–1.2)
Total Bilirubin: 0.8 mg/dL (ref 0.3–1.2)
Total Protein: 6.2 g/dL (ref 6.0–8.3)

## 2010-12-03 LAB — BASIC METABOLIC PANEL
BUN: 50 mg/dL — ABNORMAL HIGH (ref 6–23)
BUN: 19 mg/dL (ref 6–23)
BUN: 35 mg/dL — ABNORMAL HIGH (ref 6–23)
CO2: 23 mEq/L (ref 19–32)
CO2: 24 mEq/L (ref 19–32)
CO2: 25 mEq/L (ref 19–32)
CO2: 26 mEq/L (ref 19–32)
CO2: 28 mEq/L (ref 19–32)
CO2: 30 mEq/L (ref 19–32)
CO2: 30 mEq/L (ref 19–32)
Calcium: 7.9 mg/dL — ABNORMAL LOW (ref 8.4–10.5)
Calcium: 8.2 mg/dL — ABNORMAL LOW (ref 8.4–10.5)
Calcium: 8.2 mg/dL — ABNORMAL LOW (ref 8.4–10.5)
Calcium: 8.4 mg/dL (ref 8.4–10.5)
Calcium: 8.4 mg/dL (ref 8.4–10.5)
Calcium: 8.5 mg/dL (ref 8.4–10.5)
Calcium: 8.6 mg/dL (ref 8.4–10.5)
Calcium: 8.9 mg/dL (ref 8.4–10.5)
Chloride: 105 mEq/L (ref 96–112)
Chloride: 106 mEq/L (ref 96–112)
Chloride: 110 mEq/L (ref 96–112)
Chloride: 112 mEq/L (ref 96–112)
Chloride: 112 mEq/L (ref 96–112)
Chloride: 93 mEq/L — ABNORMAL LOW (ref 96–112)
Creatinine, Ser: 0.93 mg/dL (ref 0.4–1.2)
Creatinine, Ser: 1.22 mg/dL — ABNORMAL HIGH (ref 0.4–1.2)
Creatinine, Ser: 0.97 mg/dL (ref 0.4–1.2)
Creatinine, Ser: 1.15 mg/dL (ref 0.4–1.2)
GFR calc Af Amer: 50 mL/min — ABNORMAL LOW (ref >60)
GFR calc Af Amer: 52 mL/min — ABNORMAL LOW (ref >60)
GFR calc Af Amer: 52 mL/min — ABNORMAL LOW (ref >60)
GFR calc Af Amer: 53 mL/min — ABNORMAL LOW (ref >60)
GFR calc Af Amer: 53 mL/min — ABNORMAL LOW (ref >60)
GFR calc Af Amer: >60 mL/min (ref >60)
GFR calc Af Amer: >60 mL/min (ref >60)
GFR calc Af Amer: >60 mL/min (ref >60)
GFR calc non Af Amer: 43 mL/min — ABNORMAL LOW (ref >60)
GFR calc non Af Amer: 43 mL/min — ABNORMAL LOW (ref >60)
GFR calc non Af Amer: 44 mL/min — ABNORMAL LOW (ref >60)
GFR calc non Af Amer: 44 mL/min — ABNORMAL LOW (ref >60)
GFR calc non Af Amer: 52 mL/min — ABNORMAL LOW (ref >60)
GFR calc non Af Amer: 53 mL/min — ABNORMAL LOW (ref >60)
GFR calc non Af Amer: >60 mL/min (ref >60)
GFR calc non Af Amer: 49 mL/min — ABNORMAL LOW (ref >60)
Glucose, Bld: 144 mg/dL — ABNORMAL HIGH (ref 70–99)
Glucose, Bld: 164 mg/dL — ABNORMAL HIGH (ref 70–99)
Glucose, Bld: 164 mg/dL — ABNORMAL HIGH (ref 70–99)
Glucose, Bld: 166 mg/dL — ABNORMAL HIGH (ref 70–99)
Glucose, Bld: 166 mg/dL — ABNORMAL HIGH (ref 70–99)
Glucose, Bld: 171 mg/dL — ABNORMAL HIGH (ref 70–99)
Glucose, Bld: 105 mg/dL — ABNORMAL HIGH (ref 70–99)
Glucose, Bld: 159 mg/dL — ABNORMAL HIGH (ref 70–99)
Potassium: 2.8 mEq/L — ABNORMAL LOW (ref 3.5–5.1)
Potassium: 2.9 mEq/L — ABNORMAL LOW (ref 3.5–5.1)
Potassium: 3.1 mEq/L — ABNORMAL LOW (ref 3.5–5.1)
Potassium: 3.1 mEq/L — ABNORMAL LOW (ref 3.5–5.1)
Potassium: 3.2 mEq/L — ABNORMAL LOW (ref 3.5–5.1)
Potassium: 3.3 mEq/L — ABNORMAL LOW (ref 3.5–5.1)
Potassium: 3.5 mEq/L (ref 3.5–5.1)
Potassium: 3.7 mEq/L (ref 3.5–5.1)
Potassium: 3.4 mEq/L — ABNORMAL LOW (ref 3.5–5.1)
Sodium: 140 mEq/L (ref 135–145)
Sodium: 140 mEq/L (ref 135–145)
Sodium: 141 mEq/L (ref 135–145)
Sodium: 142 mEq/L (ref 135–145)
Sodium: 142 mEq/L (ref 135–145)
Sodium: 142 mEq/L (ref 135–145)
Sodium: 143 mEq/L (ref 135–145)
Sodium: 144 mEq/L (ref 135–145)
Sodium: 144 mEq/L (ref 135–145)
Sodium: 136 mEq/L (ref 135–145)

## 2010-12-03 LAB — CLOSTRIDIUM DIFFICILE EIA: C difficile Toxins A+B, EIA: NEGATIVE

## 2010-12-03 LAB — URINE CULTURE

## 2010-12-03 LAB — URINALYSIS, MICROSCOPIC ONLY
Bilirubin Urine: NEGATIVE
Ketones, ur: NEGATIVE mg/dL
Protein, ur: 100 mg/dL — AB
Urobilinogen, UA: 1 mg/dL (ref 0.0–1.0)

## 2010-12-03 LAB — URINALYSIS, ROUTINE W REFLEX MICROSCOPIC
Glucose, UA: NEGATIVE mg/dL
Ketones, ur: NEGATIVE mg/dL
Ketones, ur: NEGATIVE mg/dL
Leukocytes, UA: NEGATIVE
Leukocytes, UA: NEGATIVE
Nitrite: POSITIVE — AB
Nitrite: NEGATIVE
Nitrite: NEGATIVE
Protein, ur: 100 mg/dL — AB
Protein, ur: 30 mg/dL — AB
Specific Gravity, Urine: 1.02 (ref 1.005–1.030)
Urobilinogen, UA: 2 mg/dL — ABNORMAL HIGH (ref 0.0–1.0)
Urobilinogen, UA: 1 mg/dL (ref 0.0–1.0)
pH: 5 (ref 5.0–8.0)

## 2010-12-03 LAB — BLOOD GAS, ARTERIAL
Acid-Base Excess: 1.2 mmol/L (ref 0.0–2.0)
Bicarbonate: 24.8 mEq/L — ABNORMAL HIGH (ref 20.0–24.0)
Bicarbonate: 25.4 mEq/L — ABNORMAL HIGH (ref 20.0–24.0)
FIO2: 0.5 %
O2 Saturation: 100 %
PEEP: 5 cmH2O
Pressure support: 5 cmH2O
TCO2: 26.7 mmol/L (ref 0–100)
pCO2 arterial: 40.6 mmHg (ref 35.0–45.0)
pCO2 arterial: 41.9 mmHg (ref 35.0–45.0)
pO2, Arterial: 116 mmHg — ABNORMAL HIGH (ref 80.0–100.0)
pO2, Arterial: 170 mmHg — ABNORMAL HIGH (ref 80.0–100.0)

## 2010-12-03 LAB — CROSSMATCH

## 2010-12-03 LAB — WOUND CULTURE

## 2010-12-03 LAB — POCT I-STAT 4, (NA,K, GLUC, HGB,HCT)
Glucose, Bld: 101 mg/dL — ABNORMAL HIGH (ref 70–99)
HCT: 25 % — ABNORMAL LOW (ref 36.0–46.0)
Hemoglobin: 8.5 g/dL — ABNORMAL LOW (ref 12.0–15.0)
Potassium: 3.2 mEq/L — ABNORMAL LOW (ref 3.5–5.1)
Sodium: 136 mEq/L (ref 135–145)

## 2010-12-03 LAB — ANAEROBIC CULTURE: Gram Stain: NONE SEEN

## 2010-12-03 LAB — MRSA PCR SCREENING: MRSA by PCR: NEGATIVE

## 2010-12-03 LAB — URINE MICROSCOPIC-ADD ON

## 2010-12-03 LAB — D-DIMER, QUANTITATIVE: D-Dimer, Quant: 10.43 ug/mL-FEU — ABNORMAL HIGH (ref 0.00–0.48)

## 2010-12-03 LAB — MAGNESIUM
Magnesium: 1.9 mg/dL (ref 1.5–2.5)
Magnesium: 2.5 mg/dL (ref 1.5–2.5)
Magnesium: 2.5 mg/dL (ref 1.5–2.5)

## 2010-12-03 LAB — LACTIC ACID, PLASMA
Lactic Acid, Venous: 2 mmol/L (ref 0.5–2.2)
Lactic Acid, Venous: 2.4 mmol/L — ABNORMAL HIGH (ref 0.5–2.2)

## 2010-12-03 LAB — CULTURE, BLOOD (ROUTINE X 2): Culture: NO GROWTH

## 2010-12-03 LAB — PREALBUMIN: Prealbumin: 18.4 mg/dL (ref 18.0–45.0)

## 2010-12-03 LAB — FECAL LACTOFERRIN, QUANT

## 2010-12-03 LAB — APTT: aPTT: 36 seconds (ref 24–37)

## 2010-12-03 LAB — PROCALCITONIN: Procalcitonin: 1.06 ng/mL

## 2011-02-01 NOTE — Discharge Summary (Signed)
NAME:  Gabrielle Hopkins, Gabrielle Hopkins                 ACCOUNT NO.:  [id number]   MEDICAL RECORD NO.:  [id number]          PATIENT TYPE:  INP   LOCATION:  A332                          FACILITY:  [facility]   PHYSICIAN:  Gabrielle Moores, MD   DATE OF BIRTH:  01/25/1932   DATE OF ADMISSION:  01/16/2007  DATE OF DISCHARGE:  05/04/2008LH                               DISCHARGE SUMMARY   DISCHARGE DIAGNOSES:  1. Right lower leg cellulitis improved.  2. History of dementia.  3. Hypertension fairly controlled.  4. Systemic lupus stable.  5. History of hysterectomy and varicose vein stripping.   MEDICATIONS:  1. Augmentin 875 mg p.o. b.i.d. for 5 days.  2. Prednisone 10 mg every other day.  3. Aricept 10 mg p.o. at bedtime.  4. Namenda 10 mg p.o. b.i.d.  5. Tylenol 650 mg p.o. p.r.n. every 6 hours.   ADMISSION AND HOSPITAL COURSE:  [name] is a 77 year old female  patient with history of dementia and hypertension and systemic lupus who  presented with lower leg pain and fever.  She had right lower leg  hyperemic swelling and she was admitted with assessment of cellulitis  and she was put on Unasyn and she responded well and discharged stable.  Her home medications as well as antibiotic Augmentin to finish the  course was given and I advised her to follow-up with her PMD.   VITALS:  Vital signs during discharge was blood pressure 150/90 and  temperature 98, pulse 87 and respiratory rate was 20, saturation 97%.  HEENT: She has pink conjunctivae, nonicteric sclera.  CHEST:  Good air entry.  CVS: S1-S2 regular.  ABDOMEN:  Soft, normal bowel sounds.  EXTREMITIES: No pedal edema and right lower leg swelling and redness was  improved.  It does not have any warmness or tenderness.  CNS:  She is alert and oriented and she was discharged stable, to have  follow up here with PMD.     Gabrielle Moores, MD  Electronically Signed    MT/MEDQ  D:  01/19/2007  T:  01/20/2007  Job:  [id number]

## 2011-02-01 NOTE — H&P (Signed)
NAME:  Gabrielle Hopkins, Gabrielle Hopkins NO.:  [id number]   MEDICAL RECORD NO.:  [id number]          PATIENT TYPE:  INP   LOCATION:  A332                          FACILITY:  [facility]   PHYSICIAN:  Catalina Pizza, M.D.        DATE OF BIRTH:  1932/02/22   DATE OF ADMISSION:  01/16/2007  DATE OF DISCHARGE:  LH                              HISTORY & PHYSICAL   PRIMARY MEDICAL DOCTOR:  Dr. [facility], specifically Gabrielle Hopkins, P.A.   CHIEF COMPLAINT:  Right lower cat scratch and fever.   HISTORY OF PRESENT ILLNESS:  Gabrielle Hopkins is a 77 year old Afro-American  female who was in usual state of health when she stepped on her cat and  it scratched her across her right foot.  Shortly after, she started  having worsening redness and swelling in that leg and began having a  fever and came into the Emergency Department for further assessment.  Denies any other specific complaints other than some pain and soreness  in her right lower extremity.   PAST MEDICAL HISTORY:  1. A history of dementia.  2. Hypertension.  3. Systemic lupus.  4. A history of rheumatic fever as a child.  5. A history of hysterectomy.  6. I believe the patient has had varicose vein stripping before.   MEDICATIONS:  1. Prednisone 10 mg p.o. daily.  2. Aricept 10 mg p.o. at hour of sleep.  3. Namenda 10 mg p.o. b.i.d.  4. Tylenol 650 mg p.o. p.r.n.   ALLERGIES:  No known drug allergies.   SOCIAL HISTORY:  The patient lives with her husband.  Has four children.  She retired from [employer]gallery [employer] and [employer].  She states  that she had her tetanus less than 5 years ago.  She does not smoke or  drink use any drugs.   REVIEW OF SYSTEMS:  The patient denies any chest pain.  No problems with  breathing.  No abdominal pains.  No problems with constipation or  diarrhea.  No problems with urination.  No new neurologic issues.  Her  daughter states that she has not been eating as well over the last  several months.   OBJECTIVE:  VITAL SIGNS:  Temperature initially in the ED was 101.5 and  temperature now is 99.1.  Blood pressure 137/71.  Pulse 98.  Respirations 20.  O2 saturations are 95%.  GENERAL:  Generally, this is an elderly African American female lying in  bed in no acute distress.  HEENT:  Pupils equal, round, and react to light and accommodation.  No  scleral icterus.  Oropharynx is clear.  Mucous membranes are moist.  CARDIOVASCULAR:  Regular rate and rhythm.  NECK:  Supple.  No JVD.  No thyromegaly.  LUNGS:  Clear to auscultation bilaterally; no rhonchi or wheezing.  GI:  Abdomen is soft, nontender, nondistended, positive bowel sounds.  EXTREMITIES:  Skin is warm to touch; palpable pulses but trace in lower  extremities bilaterally but good capillary refill.  Noted significant  stasis changes in both legs bilaterally.  Right extremity, of note, does  have a linear  excoriation across the dorsum of her right foot; it does  have some erythema,  not completely extending to her toes on her right  foot.  She has had chronic problems with lower extremity stuff before  and difficult to ascertain what appears new versus old.  She has diffuse  tenderness in her right lower extremity and has approximately 2+ edema  in both legs bilaterally but right greater than left.  She has a low-  grade temperature and warmth all the way up nonspecific to this area.  NEUROLOGIC:  The patient is alert and oriented x3.  Daughter is at  bedside.  Does have some memory deficit in asking questions but still  appears very functional.   LAB WORK:  Blood cultures x2 were obtained.  CBC showed a white count  11.5, hemoglobin 11.3, platelet count 263, with significant left shift  of neutrophils 91%, ANC of 10.5.  BMET shows sodium 136, potassium 3.9,  chloride 103, CO2 26, glucose 128, BUN 14, creatinine 0.67, calcium 8.3.  No specific images were attained.   IMPRESSION:  This is a 77 year old African American female  with systemic  lupus on prednisone who apparently had a cat scratch to her right foot  and developed what appears to be a cellulitis.   ASSESSMENT/PLAN:  1. Right leg cellulitis.  Need to keep leg elevated as much as      possible to help some fluid drain out of it.  For better healing,      she is started on broad-spectrum antibiotic in the Emergency      Department and agree with continuing that with a Unasyn.  If she      again spikes any temperature, do not hesitate to start something to      cover Staph aureus.  I believe she needs several days of      intravenous antibiotics.  Transition to oral:  She was likely a      little immunosuppressed given the prednisone and due to her lupus.      We will continue to treat the temperature with Tylenol.  2. Systemic lupus:  We will continue with the prednisone at this time.      Unclear exactly what end-organ problems that she had before or      whether prednisone is just preventative.  3. Mild dementia.  The patient is on Aricept and Namenda and as      mentioned appears to be functional at home with most of her      activities of daily living.  We will continue on both these      medicines.  4. Mildly elevated blood sugars.  She was not specifically fasting at      that time but when checking a fasting blood sugar again tomorrow      morning may need to readdress.  May have certainly early signs of      diabetes which needs  to be routinely checked.      Catalina Pizza, M.D.  Electronically Signed     ZH/MEDQ  D:  01/17/2007  T:  01/17/2007  Job:  [id number]
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
## 2016-06-18 ENCOUNTER — Emergency Department (HOSPITAL_COMMUNITY): Payer: Medicare Other

## 2016-06-18 ENCOUNTER — Encounter (HOSPITAL_COMMUNITY): Payer: Self-pay | Admitting: Emergency Medicine

## 2016-06-18 ENCOUNTER — Inpatient Hospital Stay (HOSPITAL_COMMUNITY)
Admission: EM | Admit: 2016-06-18 | Discharge: 2016-06-22 | DRG: 592 | Disposition: A | Discharge status: Hospice | Payer: Medicare Other | Attending: Internal Medicine | Admitting: Internal Medicine

## 2016-06-18 DIAGNOSIS — L89159 Pressure ulcer of sacral region, unspecified stage: Secondary | ICD-10-CM | POA: Diagnosis present

## 2016-06-18 DIAGNOSIS — M1990 Unspecified osteoarthritis, unspecified site: Secondary | ICD-10-CM | POA: Diagnosis present

## 2016-06-18 DIAGNOSIS — S31000A Unspecified open wound of lower back and pelvis without penetration into retroperitoneum, initial encounter: Secondary | ICD-10-CM | POA: Diagnosis present

## 2016-06-18 DIAGNOSIS — F0390 Unspecified dementia without behavioral disturbance: Secondary | ICD-10-CM | POA: Diagnosis present

## 2016-06-18 DIAGNOSIS — I10 Essential (primary) hypertension: Secondary | ICD-10-CM | POA: Diagnosis present

## 2016-06-18 DIAGNOSIS — Z7982 Long term (current) use of aspirin: Secondary | ICD-10-CM

## 2016-06-18 DIAGNOSIS — Z79899 Other long term (current) drug therapy: Secondary | ICD-10-CM

## 2016-06-18 DIAGNOSIS — L89300 Pressure ulcer of unspecified buttock, unstageable: Secondary | ICD-10-CM

## 2016-06-18 DIAGNOSIS — R627 Adult failure to thrive: Secondary | ICD-10-CM | POA: Diagnosis present

## 2016-06-18 DIAGNOSIS — Z66 Do not resuscitate: Secondary | ICD-10-CM | POA: Diagnosis present

## 2016-06-18 DIAGNOSIS — R0682 Tachypnea, not elsewhere classified: Secondary | ICD-10-CM | POA: Diagnosis present

## 2016-06-18 DIAGNOSIS — L89154 Pressure ulcer of sacral region, stage 4: Secondary | ICD-10-CM | POA: Diagnosis not present

## 2016-06-18 DIAGNOSIS — Z7189 Other specified counseling: Secondary | ICD-10-CM

## 2016-06-18 DIAGNOSIS — Z9889 Other specified postprocedural states: Secondary | ICD-10-CM

## 2016-06-18 DIAGNOSIS — D7589 Other specified diseases of blood and blood-forming organs: Secondary | ICD-10-CM | POA: Diagnosis present

## 2016-06-18 DIAGNOSIS — Z7401 Bed confinement status: Secondary | ICD-10-CM

## 2016-06-18 DIAGNOSIS — R000 Tachycardia, unspecified: Secondary | ICD-10-CM | POA: Diagnosis present

## 2016-06-18 DIAGNOSIS — A419 Sepsis, unspecified organism: Secondary | ICD-10-CM | POA: Diagnosis present

## 2016-06-18 DIAGNOSIS — Z23 Encounter for immunization: Secondary | ICD-10-CM

## 2016-06-18 DIAGNOSIS — D649 Anemia, unspecified: Secondary | ICD-10-CM | POA: Diagnosis present

## 2016-06-18 DIAGNOSIS — M810 Age-related osteoporosis without current pathological fracture: Secondary | ICD-10-CM | POA: Diagnosis present

## 2016-06-18 DIAGNOSIS — Z515 Encounter for palliative care: Secondary | ICD-10-CM

## 2016-06-18 HISTORY — DX: Systemic lupus erythematosus, unspecified: M32.9

## 2016-06-18 HISTORY — DX: Reserved for concepts with insufficient information to code with codable children: IMO0002

## 2016-06-18 HISTORY — DX: Age-related osteoporosis without current pathological fracture: M81.0

## 2016-06-18 HISTORY — DX: Unspecified osteoarthritis, unspecified site: M19.90

## 2016-06-18 HISTORY — DX: Essential (primary) hypertension: I10

## 2016-06-18 HISTORY — DX: Unspecified dementia, unspecified severity, without behavioral disturbance, psychotic disturbance, mood disturbance, and anxiety: F03.90

## 2016-06-18 HISTORY — DX: Encounter for other specified aftercare: Z51.89

## 2016-06-18 LAB — CBC WITH DIFFERENTIAL/PLATELET
Basophils Absolute: 0 10*3/uL (ref 0.0–0.1)
Basophils Relative: 0 %
EOS ABS: 0.2 10*3/uL (ref 0.0–0.7)
Eosinophils Relative: 1 %
HCT: 32 % — ABNORMAL LOW (ref 36.0–46.0)
HEMOGLOBIN: 9.9 g/dL — AB (ref 12.0–15.0)
LYMPHS ABS: 1.6 10*3/uL (ref 0.7–4.0)
Lymphocytes Relative: 8 %
MCH: 30.7 pg (ref 26.0–34.0)
MCHC: 30.9 g/dL (ref 30.0–36.0)
MCV: 99.4 fL (ref 78.0–100.0)
MONOS PCT: 3 %
Monocytes Absolute: 0.6 10*3/uL (ref 0.1–1.0)
NEUTROS PCT: 88 %
Neutro Abs: 18 10*3/uL — ABNORMAL HIGH (ref 1.7–7.7)
Platelets: 452 10*3/uL — ABNORMAL HIGH (ref 150–400)
RBC: 3.22 MIL/uL — ABNORMAL LOW (ref 3.87–5.11)
RDW: 14.8 % (ref 11.5–15.5)
WBC: 20.5 10*3/uL — ABNORMAL HIGH (ref 4.0–10.5)

## 2016-06-18 LAB — URINALYSIS, ROUTINE W REFLEX MICROSCOPIC
BILIRUBIN URINE: NEGATIVE
Glucose, UA: NEGATIVE mg/dL
HGB URINE DIPSTICK: NEGATIVE
KETONES UR: 15 mg/dL — AB
Leukocytes, UA: NEGATIVE
Nitrite: NEGATIVE
PROTEIN: NEGATIVE mg/dL
Specific Gravity, Urine: >1.03 — ABNORMAL HIGH (ref 1.005–1.030)
pH: 5 (ref 5.0–8.0)

## 2016-06-18 LAB — COMPREHENSIVE METABOLIC PANEL
ALBUMIN: 2.4 g/dL — AB (ref 3.5–5.0)
ALK PHOS: 72 U/L (ref 38–126)
ALT: 64 U/L — ABNORMAL HIGH (ref 14–54)
AST: 66 U/L — AB (ref 15–41)
Anion gap: 7 (ref 5–15)
BILIRUBIN TOTAL: 0.3 mg/dL (ref 0.3–1.2)
BUN: 37 mg/dL — AB (ref 6–20)
CALCIUM: 8.4 mg/dL — AB (ref 8.9–10.3)
CO2: 33 mmol/L — ABNORMAL HIGH (ref 22–32)
Chloride: 105 mmol/L (ref 101–111)
Creatinine, Ser: 1.1 mg/dL — ABNORMAL HIGH (ref 0.44–1.00)
GFR calc Af Amer: 53 mL/min — ABNORMAL LOW (ref >60)
GFR calc non Af Amer: 46 mL/min — ABNORMAL LOW (ref >60)
GLUCOSE: 158 mg/dL — AB (ref 65–99)
POTASSIUM: 3.8 mmol/L (ref 3.5–5.1)
Sodium: 145 mmol/L (ref 135–145)
TOTAL PROTEIN: 7.9 g/dL (ref 6.5–8.1)

## 2016-06-18 LAB — I-STAT CG4 LACTIC ACID, ED: Lactic Acid, Venous: 3.04 mmol/L (ref 0.5–1.9)

## 2016-06-18 MED ORDER — PIPERACILLIN-TAZOBACTAM 3.375 G IVPB 30 MIN
3.3750 g | Freq: Once | INTRAVENOUS | Status: AC
  Administered 2016-06-18: 3.375 g via INTRAVENOUS
  Filled 2016-06-18: qty 50

## 2016-06-18 MED ORDER — SODIUM CHLORIDE 0.9 % IV BOLUS (SEPSIS)
1000.0000 mL | Freq: Once | INTRAVENOUS | Status: AC
  Administered 2016-06-18: 1000 mL via INTRAVENOUS

## 2016-06-18 MED ORDER — HYDROGEN PEROXIDE 3 % EX SOLN
CUTANEOUS | Status: AC
  Filled 2016-06-18: qty 473

## 2016-06-18 MED ORDER — VANCOMYCIN HCL 500 MG IV SOLR: 500.0000 mg | INTRAVENOUS | Status: DC

## 2016-06-18 MED ORDER — VANCOMYCIN HCL IN DEXTROSE 1-5 GM/200ML-% IV SOLN
1000.0000 mg | Freq: Once | INTRAVENOUS | Status: AC
  Administered 2016-06-18: 1000 mg via INTRAVENOUS
  Filled 2016-06-18: qty 200

## 2016-06-18 MED ORDER — PIPERACILLIN-TAZOBACTAM 3.375 G IVPB: 3.3750 g | Freq: Three times a day (TID) | INTRAVENOUS | Status: DC

## 2016-06-18 NOTE — Progress Notes (Signed)
Pharmacy Antibiotic Note  Gabrielle Hopkins is a 82 y.o. female admitted on 06/18/2016 with sepsis.  Pharmacy has been consulted for vanc and zosyn dosing. Initial doses ordered in the ED  Plan: Cont vanc 500 mg IV q24 hours Cont zosyn 3.375 gm IV q8 hours F/u renal function, cultures and clinical course  Height: 5\' 3"  (160 cm) Weight: 115 lb (52.2 kg) IBW/kg (Calculated) : 52.4  Temp (24hrs), Avg:98.6 F (37 C), Min:98.6 F (37 C), Max:98.6 F (37 C)   Recent Labs Lab 06/18/16 2110  CREATININE 1.10*    Estimated Creatinine Clearance: 33.1 mL/min (by C-G formula based on SCr of 1.1 mg/dL (H)).    No Known Allergies  Antimicrobials this admission: vanc 10/3 >>  zosyn 10/3 >>   Thank you for allowing pharmacy to be a part of this patient's care.  Talbert CageSeay, Danelle Curiale Poteet 06/18/2016 10:16 PM

## 2016-06-18 NOTE — ED Triage Notes (Signed)
Pt brought in by family for increased weakness and not eating for 2 days.

## 2016-06-18 NOTE — ED Provider Notes (Signed)
[facility]EMERGENCY DEPT Provider Note   CSN: [id number] Arrival date & time: 06/18/16  2016  By signing my name below, I, Soijett Blue, attest that this documentation has been prepared under the direction and in the presence of Eber Hong, MD. Electronically Signed: Soijett Blue, ED Scribe. 06/18/16. 9:12 PM.   History   Chief Complaint Chief Complaint  Patient presents with  . Fatigue   LEVEL 5 CAVEAT: DEMENTIA  HPI Gabrielle Hopkins is a 82 y.o. female with a PMHx lupus, dementia, HTN, who presents to the Emergency Department complaining of fatigue onset 2-3 days. Family reports that the pt has had a decreased appetite recently and that is what prompted the family to bring the pt into the ED tonight. Family states that they typically spoon-feed the pt, and that she normally has an appetite. Family states that the pt lives with relatives. Family denies the pt ambulating or standing on her own recently, but notes that the pt would stand with assistance 2-3 days ago. Family states that the pt is having associated symptoms of bed sore to buttocks x 2 months and decreased appetite. Family denies the pt having a wound nurse taking care of the bedsore to her buttocks. Family states that the pt is picking at the bed sore area. Family denies the pt being given any medications for the relief of her symptoms. Family denies the pt having fever, vomiting, recent falls, vomiting, diarrhea, and any other symptoms. Family denies the pt having an hx of liver failure or kidney issues.     The history is provided by the patient and a relative. No language interpreter was used.    Past Medical History:  Diagnosis Date  . Arthritis   . Blood transfusion without reported diagnosis   . Dementia   . Hypertension   . Lupus   . Osteoporosis     Patient Active Problem List   Diagnosis Date Noted  . Sepsis affecting skin (HCC) 06/18/2016  . Dementia 06/18/2016  . Sacral wound, initial encounter 06/18/2016  .  Hypertension 06/18/2016    Past Surgical History:  Procedure Laterality Date  . BACK SURGERY    . VASCULAR SURGERY      OB History    No data available       Home Medications    Prior to Admission medications   Medication Sig Start Date End Date Taking? Authorizing Provider  aspirin EC 325 MG tablet Take 325 mg by mouth daily.   Yes Historical Provider, MD  diltiazem (CARDIZEM) 30 MG tablet Take 30 mg by mouth 4 (four) times daily - after meals and at bedtime.   Yes Historical Provider, MD  donepezil (ARICEPT) 10 MG tablet Take 10 mg by mouth daily.   Yes Historical Provider, MD  hydrochlorothiazide (HYDRODIURIL) 25 MG tablet Take 25 mg by mouth daily.   Yes Historical Provider, MD  memantine (NAMENDA) 10 MG tablet Take 10 mg by mouth 2 (two) times daily.   Yes Historical Provider, MD  mupirocin ointment (BACTROBAN) 2 % Apply 1 application topically 2 (two) times daily. Applied at dressing changes 06/15/16  Yes Historical Provider, MD  potassium chloride (K-DUR) 10 MEQ tablet Take 10 mEq by mouth daily.   Yes Historical Provider, MD    Family History History reviewed. No pertinent family history.  Social History Social History  Substance Use Topics  . Smoking status: Never Smoker  . Smokeless tobacco: Never Used  . Alcohol use No     Allergies  Review of patient's allergies indicates no known allergies.   Review of Systems Review of Systems  Unable to perform ROS: Dementia     Physical Exam Updated Vital Signs BP 103/76 (BP Location: Left Arm)   Pulse 107   Temp 98.6 F (37 C) (Temporal)   Resp (!) 36   Ht 5\' 3"  (1.6 m)   Wt 115 lb (52.2 kg)   SpO2 99%   BMI 20.37 kg/m   Physical Exam  Constitutional: She appears well-developed and well-nourished. No distress.  HENT:  Head: Normocephalic and atraumatic.  Mouth/Throat: Oropharynx is clear and moist. No oropharyngeal exudate.  Eyes: Conjunctivae and EOM are normal. Pupils are equal, round, and  reactive to light. Right eye exhibits no discharge. Left eye exhibits no discharge. No scleral icterus.  Neck: Normal range of motion. Neck supple. No JVD present. No thyromegaly present.  Cardiovascular: Normal rate, regular rhythm, normal heart sounds and intact distal pulses.  Exam reveals no gallop and no friction rub.   No murmur heard. Pulmonary/Chest: Effort normal and breath sounds normal. No respiratory distress. She has no wheezes. She has no rales.  Abdominal: Soft. Bowel sounds are normal. She exhibits no distension and no mass. There is no tenderness.  Musculoskeletal: Normal range of motion. She exhibits no edema or tenderness.  Lymphadenopathy:    She has no cervical adenopathy.  Neurological: She is alert. Coordination normal.  Skin: Skin is warm and dry. No rash noted. No erythema.  Cavernous sacral decubitus. Foul smelling drainage.   Psychiatric: She has a normal mood and affect. Her behavior is normal.  Nursing note and vitals reviewed.    ED Treatments / Results  DIAGNOSTIC STUDIES: Oxygen Saturation is 99% on RA, nl by my interpretation.    COORDINATION OF CARE: 9:06 PM Discussed treatment plan with pt family at bedside which includes labs, UA, and pt family agreed to plan.   Labs (all labs ordered are listed, but only abnormal results are displayed) Labs Reviewed  COMPREHENSIVE METABOLIC PANEL - Abnormal; Notable for the following:       Result Value   CO2 33 (*)    Glucose, Bld 158 (*)    BUN 37 (*)    Creatinine, Ser 1.10 (*)    Calcium 8.4 (*)    Albumin 2.4 (*)    AST 66 (*)    ALT 64 (*)    GFR calc non Af Amer 46 (*)    GFR calc Af Amer 53 (*)    All other components within normal limits  CBC WITH DIFFERENTIAL/PLATELET - Abnormal; Notable for the following:    WBC 20.5 (*)    RBC 3.22 (*)    Hemoglobin 9.9 (*)    HCT 32.0 (*)    Platelets 452 (*)    Neutro Abs 18.0 (*)    All other components within normal limits  URINALYSIS, ROUTINE W  REFLEX MICROSCOPIC (NOT AT [facility]) - Abnormal; Notable for the following:    Specific Gravity, Urine >1.030 (*)    Ketones, ur 15 (*)    All other components within normal limits  I-STAT CG4 LACTIC ACID, ED - Abnormal; Notable for the following:    Lactic Acid, Venous 3.04 (*)    All other components within normal limits  CULTURE, BLOOD (ROUTINE X 2)  CULTURE, BLOOD (ROUTINE X 2)  URINE CULTURE  I-STAT CG4 LACTIC ACID, ED    ED ECG REPORT  I personally interpreted this EKG   Date: 06/18/2016  Rate: 98  Rhythm: normal sinus rhythm  QRS Axis: left  Intervals: normal  ST/T Wave abnormalities: normal  Conduction Disutrbances:none  Narrative Interpretation:   Old EKG Reviewed: none available  Radiology Dg Chest 2 View  Result Date: 06/18/2016 CLINICAL DATA:  Increased weakness and anorexia. EXAM: CHEST  2 VIEW COMPARISON:  03/05/2010 FINDINGS: There is kyphosis of the mid thoracic spine with osteopenia. The lateral view was acquired with patient in the seated position as a result. There is right upper lobe scarring. No pneumonic consolidation, pulmonary edema nor effusion. Heart is enlarged. The thoracic aorta is atherosclerotic and tortuous. Attenuated pulmonary vessels and bilateral thin linear interstitial markings may reflect areas of interstitial fibrosis and/or emphysematous hyperinflation. IMPRESSION: Cardiomegaly. Right upper lobe scarring. Mild interstitial fibrosis. Mild emphysematous hyperinflation. Electronically Signed   By: Tollie Eth M.D.   On: 06/18/2016 21:32    Procedures Procedures (including critical care time)  Medications Ordered in ED Medications  piperacillin-tazobactam (ZOSYN) IVPB 3.375 g (not administered)  vancomycin (VANCOCIN) 500 mg in sodium chloride 0.9 % 100 mL IVPB (not administered)  sodium chloride 0.9 % bolus 1,000 mL (1,000 mLs Intravenous New Bag/Given 06/18/16 2126)  piperacillin-tazobactam (ZOSYN) IVPB 3.375 g (0 g Intravenous Stopped 06/18/16  2150)  vancomycin (VANCOCIN) IVPB 1000 mg/200 mL premix (0 mg Intravenous Stopped 06/18/16 2256)     Initial Impression / Assessment and Plan / ED Course  I have reviewed the triage vital signs and the nursing notes.  Pertinent labs & imaging results that were available during my care of the patient were reviewed by me and considered in my medical decision making (see chart for details).  Clinical Course   The pt has signs of severe infection from a deep decubitus ulcer - she has a caverous are over the sacrum that is purulent and foul smelling.  Her WBC is 20,500, her Hgb is 9.9 and platelets are 452.  Her chemistry shows elevated BUN c/w not eating or drinking - UA clean - needs admission, hydration and antibiotics.    D/w Dr. Antionette Char who will admit  Final Clinical Impressions(s) / ED Diagnoses   Final diagnoses:  Decubitus ulcer of buttock, unstageable, unspecified laterality (HCC)    New Prescriptions New Prescriptions   No medications on file    I personally performed the services described in this documentation, which was scribed in my presence. The recorded information has been reviewed and is accurate.        Eber Hong, MD 06/18/16 [phone]

## 2016-06-18 NOTE — ED Triage Notes (Signed)
Family states she also has a bedsore on her sacral area.

## 2016-06-19 DIAGNOSIS — Z66 Do not resuscitate: Secondary | ICD-10-CM | POA: Diagnosis present

## 2016-06-19 DIAGNOSIS — Z7982 Long term (current) use of aspirin: Secondary | ICD-10-CM | POA: Diagnosis not present

## 2016-06-19 DIAGNOSIS — D7589 Other specified diseases of blood and blood-forming organs: Secondary | ICD-10-CM | POA: Diagnosis present

## 2016-06-19 DIAGNOSIS — R0682 Tachypnea, not elsewhere classified: Secondary | ICD-10-CM | POA: Diagnosis present

## 2016-06-19 DIAGNOSIS — R627 Adult failure to thrive: Secondary | ICD-10-CM | POA: Diagnosis present

## 2016-06-19 DIAGNOSIS — Z9889 Other specified postprocedural states: Secondary | ICD-10-CM | POA: Diagnosis not present

## 2016-06-19 DIAGNOSIS — A419 Sepsis, unspecified organism: Secondary | ICD-10-CM | POA: Diagnosis not present

## 2016-06-19 DIAGNOSIS — D649 Anemia, unspecified: Secondary | ICD-10-CM | POA: Diagnosis present

## 2016-06-19 DIAGNOSIS — M1990 Unspecified osteoarthritis, unspecified site: Secondary | ICD-10-CM | POA: Diagnosis present

## 2016-06-19 DIAGNOSIS — Z7401 Bed confinement status: Secondary | ICD-10-CM | POA: Diagnosis not present

## 2016-06-19 DIAGNOSIS — I10 Essential (primary) hypertension: Secondary | ICD-10-CM | POA: Diagnosis present

## 2016-06-19 DIAGNOSIS — S31000A Unspecified open wound of lower back and pelvis without penetration into retroperitoneum, initial encounter: Secondary | ICD-10-CM

## 2016-06-19 DIAGNOSIS — Z515 Encounter for palliative care: Secondary | ICD-10-CM | POA: Diagnosis not present

## 2016-06-19 DIAGNOSIS — Z23 Encounter for immunization: Secondary | ICD-10-CM | POA: Diagnosis not present

## 2016-06-19 DIAGNOSIS — R000 Tachycardia, unspecified: Secondary | ICD-10-CM | POA: Diagnosis present

## 2016-06-19 DIAGNOSIS — L89300 Pressure ulcer of unspecified buttock, unstageable: Secondary | ICD-10-CM | POA: Diagnosis not present

## 2016-06-19 DIAGNOSIS — L89154 Pressure ulcer of sacral region, stage 4: Secondary | ICD-10-CM | POA: Diagnosis present

## 2016-06-19 DIAGNOSIS — M810 Age-related osteoporosis without current pathological fracture: Secondary | ICD-10-CM | POA: Diagnosis present

## 2016-06-19 DIAGNOSIS — F0390 Unspecified dementia without behavioral disturbance: Secondary | ICD-10-CM | POA: Diagnosis present

## 2016-06-19 DIAGNOSIS — Z79899 Other long term (current) drug therapy: Secondary | ICD-10-CM | POA: Diagnosis not present

## 2016-06-19 LAB — APTT: APTT: 37 s — AB (ref 24–36)

## 2016-06-19 LAB — CBC WITH DIFFERENTIAL/PLATELET
BASOS ABS: 0 10*3/uL (ref 0.0–0.1)
Basophils Relative: 0 %
Eosinophils Absolute: 0.4 10*3/uL (ref 0.0–0.7)
Eosinophils Relative: 2 %
HEMATOCRIT: 29.9 % — AB (ref 36.0–46.0)
Hemoglobin: 9.3 g/dL — ABNORMAL LOW (ref 12.0–15.0)
LYMPHS PCT: 9 %
Lymphs Abs: 1.4 10*3/uL (ref 0.7–4.0)
MCH: 30.6 pg (ref 26.0–34.0)
MCHC: 31.1 g/dL (ref 30.0–36.0)
MCV: 98.4 fL (ref 78.0–100.0)
MONO ABS: 0.4 10*3/uL (ref 0.1–1.0)
MONOS PCT: 3 %
NEUTROS ABS: 14.4 10*3/uL — AB (ref 1.7–7.7)
Neutrophils Relative %: 86 %
Platelets: 337 10*3/uL (ref 150–400)
RBC: 3.04 MIL/uL — ABNORMAL LOW (ref 3.87–5.11)
RDW: 14.8 % (ref 11.5–15.5)
WBC: 16.7 10*3/uL — ABNORMAL HIGH (ref 4.0–10.5)

## 2016-06-19 LAB — COMPREHENSIVE METABOLIC PANEL
ALBUMIN: 2.1 g/dL — AB (ref 3.5–5.0)
ALT: 50 U/L (ref 14–54)
ANION GAP: 4 — AB (ref 5–15)
AST: 43 U/L — ABNORMAL HIGH (ref 15–41)
Alkaline Phosphatase: 61 U/L (ref 38–126)
BUN: 32 mg/dL — ABNORMAL HIGH (ref 6–20)
CO2: 32 mmol/L (ref 22–32)
Calcium: 7.8 mg/dL — ABNORMAL LOW (ref 8.9–10.3)
Chloride: 109 mmol/L (ref 101–111)
Creatinine, Ser: 0.89 mg/dL (ref 0.44–1.00)
GFR calc Af Amer: >60 mL/min (ref >60)
GFR calc non Af Amer: 59 mL/min — ABNORMAL LOW (ref >60)
GLUCOSE: 122 mg/dL — AB (ref 65–99)
POTASSIUM: 3.3 mmol/L — AB (ref 3.5–5.1)
SODIUM: 145 mmol/L (ref 135–145)
TOTAL PROTEIN: 6.8 g/dL (ref 6.5–8.1)
Total Bilirubin: 0.5 mg/dL (ref 0.3–1.2)

## 2016-06-19 LAB — PROTIME-INR
INR: 1.22
PROTHROMBIN TIME: 15.5 s — AB (ref 11.4–15.2)

## 2016-06-19 LAB — GLUCOSE, CAPILLARY: GLUCOSE-CAPILLARY: 123 mg/dL — AB (ref 65–99)

## 2016-06-19 LAB — SEDIMENTATION RATE: Sed Rate: 128 mm/hr — ABNORMAL HIGH (ref 0–22)

## 2016-06-19 LAB — LACTIC ACID, PLASMA
Lactic Acid, Venous: 2.3 mmol/L (ref 0.5–1.9)
Lactic Acid, Venous: 1.5 mmol/L (ref 0.5–1.9)

## 2016-06-19 LAB — C-REACTIVE PROTEIN: CRP: 17.5 mg/dL — ABNORMAL HIGH (ref <1.0)

## 2016-06-19 LAB — PROCALCITONIN: Procalcitonin: 0.16 ng/mL

## 2016-06-19 MED ORDER — DAKINS (1/4 STRENGTH) 0.125 % EX SOLN
Freq: Two times a day (BID) | CUTANEOUS | Status: AC
  Administered 2016-06-19: 1
  Administered 2016-06-19 – 2016-06-20 (×2)
  Administered 2016-06-20: 1
  Administered 2016-06-21 (×2)
  Filled 2016-06-19: qty 473

## 2016-06-19 MED ORDER — ASPIRIN EC 325 MG PO TBEC
325.0000 mg | DELAYED_RELEASE_TABLET | Freq: Every day | ORAL | Status: DC
  Administered 2016-06-20 – 2016-06-22 (×3): 325 mg via ORAL
  Filled 2016-06-19 (×4): qty 1

## 2016-06-19 MED ORDER — SODIUM CHLORIDE 0.9 % IV SOLN
INTRAVENOUS | Status: DC
  Administered 2016-06-19 – 2016-06-22 (×3): via INTRAVENOUS

## 2016-06-19 MED ORDER — HEPARIN SODIUM (PORCINE) 5000 UNIT/ML IJ SOLN
5000.0000 [IU] | Freq: Three times a day (TID) | INTRAMUSCULAR | Status: DC
  Administered 2016-06-19 – 2016-06-22 (×9): 5000 [IU] via SUBCUTANEOUS
  Filled 2016-06-19 (×9): qty 1

## 2016-06-19 MED ORDER — SODIUM CHLORIDE 0.9 % IV SOLN
INTRAVENOUS | Status: AC
  Filled 2016-06-19: qty 500

## 2016-06-19 MED ORDER — ACETAMINOPHEN 650 MG RE SUPP: 650.0000 mg | Freq: Four times a day (QID) | RECTAL | Status: DC | PRN

## 2016-06-19 MED ORDER — SODIUM CHLORIDE 0.9 % IV SOLN
500.0000 mg | Freq: Three times a day (TID) | INTRAVENOUS | Status: DC
  Administered 2016-06-19 – 2016-06-22 (×10): 500 mg via INTRAVENOUS
  Filled 2016-06-19 (×16): qty 500

## 2016-06-19 MED ORDER — ONDANSETRON HCL 4 MG PO TABS: 4.0000 mg | ORAL_TABLET | Freq: Four times a day (QID) | ORAL | Status: DC | PRN

## 2016-06-19 MED ORDER — ONDANSETRON HCL 4 MG/2ML IJ SOLN: 4.0000 mg | Freq: Four times a day (QID) | INTRAMUSCULAR | Status: DC | PRN

## 2016-06-19 MED ORDER — DILTIAZEM HCL 30 MG PO TABS: 30.0000 mg | ORAL_TABLET | Freq: Three times a day (TID) | ORAL | Status: DC

## 2016-06-19 MED ORDER — MEMANTINE HCL 10 MG PO TABS
10.0000 mg | ORAL_TABLET | Freq: Two times a day (BID) | ORAL | Status: DC
  Administered 2016-06-19 – 2016-06-22 (×6): 10 mg via ORAL
  Filled 2016-06-19 (×7): qty 1

## 2016-06-19 MED ORDER — IMIPENEM-CILASTATIN 500 MG IV SOLR
500.0000 mg | Freq: Once | INTRAVENOUS | Status: AC
  Administered 2016-06-19: 500 mg via INTRAVENOUS
  Filled 2016-06-19: qty 500

## 2016-06-19 MED ORDER — VANCOMYCIN HCL 500 MG IV SOLR
500.0000 mg | Freq: Two times a day (BID) | INTRAVENOUS | Status: DC
  Administered 2016-06-19 – 2016-06-22 (×7): 500 mg via INTRAVENOUS
  Filled 2016-06-19 (×11): qty 500

## 2016-06-19 MED ORDER — BISACODYL 10 MG RE SUPP: 10.0000 mg | Freq: Every day | RECTAL | Status: DC | PRN

## 2016-06-19 MED ORDER — SODIUM CHLORIDE 0.9 % IV SOLN
INTRAVENOUS | Status: AC
  Administered 2016-06-19: 02:00:00 via INTRAVENOUS

## 2016-06-19 MED ORDER — ACETAMINOPHEN 325 MG PO TABS: 650.0000 mg | ORAL_TABLET | Freq: Four times a day (QID) | ORAL | Status: DC | PRN

## 2016-06-19 MED ORDER — SODIUM CHLORIDE 0.9 % IV BOLUS (SEPSIS): 750.0000 mL | Freq: Once | INTRAVENOUS | Status: AC

## 2016-06-19 MED ORDER — DONEPEZIL HCL 5 MG PO TABS
10.0000 mg | ORAL_TABLET | Freq: Every day | ORAL | Status: DC
  Administered 2016-06-20 – 2016-06-22 (×3): 10 mg via ORAL
  Filled 2016-06-19 (×9): qty 2
  Filled 2016-06-19: qty 1

## 2016-06-19 NOTE — Progress Notes (Signed)
ANTIBIOTIC CONSULT NOTE-Preliminary  Pharmacy Consult for Imipenem Indication: Sepsis  No Known Allergies  Patient Measurements: Height: 5\' 6"  (167.6 cm) Weight: 134 lb 7.7 oz (61 kg) IBW/kg (Calculated) : 59.3  Vital Signs: Temp: 98.5 F (36.9 C) (10/04 0028) Temp Source: Oral (10/04 0028) BP: 139/69 (10/04 0028) Pulse Rate: 40 (10/04 0028)  Labs:  Recent Labs  06/18/16 2110  WBC 20.5*  HGB 9.9*  PLT 452*  CREATININE 1.10*    Estimated Creatinine Clearance: 37.5 mL/min (by C-G formula based on SCr of 1.1 mg/dL (H)).  No results for input(s): VANCOTROUGH, VANCOPEAK, VANCORANDOM, GENTTROUGH, GENTPEAK, GENTRANDOM, TOBRATROUGH, TOBRAPEAK, TOBRARND, AMIKACINPEAK, AMIKACINTROU, AMIKACIN in the last 72 hours.   Microbiology: Recent Results (from the past 720 hour(s))  Culture, blood (Routine x 2)     Status: None (Preliminary result)   Collection Time: 06/18/16  9:10 PM  Result Value Ref Range Status   Specimen Description BLOOD RIGHT FOREARM  Final   Special Requests BOTTLES DRAWN AEROBIC AND ANAEROBIC [facility] EACH  Final   Culture PENDING  Incomplete   Report Status PENDING  Incomplete  Culture, blood (Routine x 2)     Status: None (Preliminary result)   Collection Time: 06/18/16  9:16 PM  Result Value Ref Range Status   Specimen Description BLOOD RIGHT HAND  Final   Special Requests BOTTLES DRAWN AEROBIC AND ANAEROBIC [facility] EACH  Final   Culture PENDING  Incomplete   Report Status PENDING  Incomplete    Medical History: Past Medical History:  Diagnosis Date  . Arthritis   . Blood transfusion without reported diagnosis   . Dementia   . Hypertension   . Lupus   . Osteoporosis     Medications:  Vancomycin 500 mg IV  10/03>> Zosyn 3.375 Gm Iv x 1 ED dose   Assessment: 82 yo female with sepsis secondary to infected sacral decubitus ulcer; empiric antibiotics with Vancomycin and Imipenem given prior sacral wound culture with MDR pseudomonas and  acinetobacter.  Goal of Therapy:  Eradicate infection  Plan:  Preliminary review of pertinent patient information completed.  Protocol will be initiated with a one-time dose of Imipenem 500 mg IV.  Jeani HawkingAnnie Penn clinical pharmacist will complete review during morning rounds to assess patient and finalize treatment regimen.  Arelia SneddonMason, Jeraldine Primeau Anne, [facility] DallasRPH 06/19/2016,12:41 AM

## 2016-06-19 NOTE — Progress Notes (Signed)
Unable to complete admission questions due to pt unable to answer. Family member is currently not present.

## 2016-06-19 NOTE — Progress Notes (Signed)
Received verbal order to in-and-out catheterize patient from Dr. Ardyth HarpsHernandez.  If afterwards patient does not void within 8 hours, also received verbal order to insert Foley catheter.

## 2016-06-19 NOTE — Progress Notes (Signed)
Critical lactic acid 2.3, down from 3.4.

## 2016-06-19 NOTE — Care Management Obs Status (Signed)
MEDICARE OBSERVATION STATUS NOTIFICATION   Patient Details  Name: Gabrielle Hopkins MRN: [id number] Date of Birth: 12-15-1931   Medicare Observation Status Notification Given:  Yes    Malcolm MetroChildress, Tyshon Fanning Demske, RN 06/19/2016, 9:23 AM

## 2016-06-19 NOTE — Progress Notes (Signed)
Pharmacy Antibiotic Note  Gabrielle Hopkins is a 82 y.o. female admitted on 06/18/2016 with sepsis.  Pharmacy has been consulted for Primaxin and Vancomycin dosing. Patient has a previous h/o acinetobacter and Pseudomonas in wound from 03/2010  Plan: Primaxin 500mg  IV q8h Vancomycin 1000mg  IV loading dose given in ED, then 500mg  IV q12h. Goal 15-20 mcg/ml F/U cultures and clinical progress Monitor V/S and labs Vancomycin trough levels as indicated  Height: 5\' 6"  (167.6 cm) Weight: 136 lb 7.4 oz (61.9 kg) IBW/kg (Calculated) : 59.3  Temp (24hrs), Avg:98.1 F (36.7 C), Min:97.2 F (36.2 C), Max:98.6 F (37 C)   Recent Labs Lab 06/18/16 2110 06/18/16 2206 06/19/16 0313  WBC 20.5*  --  16.7*  CREATININE 1.10*  --  0.89  LATICACIDVEN 2.3* 3.04* 1.5    Estimated Creatinine Clearance: 46.4 mL/min (by C-G formula based on SCr of 0.89 mg/dL).    No Known Allergies  Antimicrobials this admission: Vancomycin 10/3 >>  Primaxin 10/4 >>  Zosyn 10/3 x 1 dose in ED  Dose adjustments this admission: n/a  Microbiology results: 10/3 BCx: pending 10/3 UCx: in process  Thank you for allowing pharmacy to be a part of this patient's care. Elder CyphersLorie Asuna Peth, BS Pharm D, BCPS Clinical Pharmacist Pager [phone] 06/19/2016 8:02 AM

## 2016-06-19 NOTE — Progress Notes (Signed)
Patient seen and examined, database reviewed. Admitted earlier today from home with FTT, decreased appetite. 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 Agree with continued broad-spectrum IV abx. Strongly recommend palliative care consultation (have requested it) given her advanced age and dementia. I believe she should be transitioned to full comfort care. Will discuss with family members at a later time as they are currently not available. Also has develpped acute urinary retention and I and O cath has been performed. May need foley if continues to be unable to void spontaneously.  Will continue to follow.  Peggye PittEstela Hernandez, MD [facility] Pager: [phone]

## 2016-06-19 NOTE — Consult Note (Signed)
WOC Nurse wound consult note Reason for Consult: Stage 4 pressure injury to sacrum, present on admission.  Lives at home with family.   Wound type:Stage 4 pressure injury Pressure Ulcer POA: Yes Measurement: 6 cm x 4 cm x 4 cm with wound bed 100% devitalized, adherent gray slough.  Bone is directly palpable.  Wound VHQ:IONGbed:gray slough Drainage (amount, consistency, odor) Moderate purulent drainage.  Foul, necrotic odor.  Periwound:Evidence of medical adhesive related skin injury (MARSI) from tape.  Family states she also "scratches back there" Dressing procedure/placement/frequency:Cleanse sacral wound with NS and pat gently dry.  Fill wound with Dakin's moistened kerlix.  Cover with ABD pad.  Change twice daily.  Will not follow at this time.  Please re-consult if needed.  Maple HudsonKaren Sharise Lippy RN BSN CWON Pager [phone]

## 2016-06-19 NOTE — ED Notes (Signed)
Son Gabrielle Hopkins [phone](H) [phone]([phone] Daughter Gabrielle Hopkins [phone]

## 2016-06-19 NOTE — H&P (Signed)
History and Physical    Gabrielle Hopkins ZOX:[id number] DOB: 1932/01/30 DOA: 06/18/2016  PCP: Gabrielle Jester, DO   Patient coming from: Home  Chief Complaint: No longer eating, sacral wound  HPI: Gabrielle Hopkins is a 82 y.o. female with medical history significant for hypertension, advanced dementia, and sacral ulcer who presents to the emergency department, brought in by family for evaluation of her failure to eat for the past 2 days. At the patient's baseline, she is not verbal and does not stand on her own. She is typically spoonfed by her family with whom she lives, but has not opened her mouth to eat for the past 2 days. Family also notes that she has a wound at the sacrum with home health providing dressing changes. Patient is not known to voice any specific complaint, she has not appeared to family to be in distress or discomfort, and has not been noted to have a cough, vomiting, or diarrhea. Patient has not taken her medications for the past 2 days as they're typically given with her food. There is been no melena or hematochezia noted.   ED Course: Upon arrival to the ED, patient is found to be afebrile, saturating well on room air, tachypneic in the 30s, tachycardic in the low 100s, and with stable blood pressure. Chest x-ray is consistent with emphysematous changes and cardiomegaly, but negative for acute cardiopulmonary disease. CMP features a BUN of 37 and stable serum creatinine at 1.10. CBC features a leukocytosis to 20,500, thrombocytosis to 452,000, and a stable normocytic anemia with hemoglobin of 9.9. Urinalysis is notable for elevated specific gravity, but not suggestive of infection. Lactic acid returned elevated to a value of 3.04. Blood and urine cultures were obtained, 1 L of normal saline was given as a bolus, and empiric treatment with vancomycin and Zosyn was initiated in the emergency department. Tachycardia resolved with fluids and blood pressure remained stable. Patient will be  observed on the medical/surgical unit for ongoing evaluation and management of sepsis suspected secondary to infected sacral ulcer.  Review of Systems:  Unable to obtain ROS secondary to patient's clinical condition with advanced dementia.  Past Medical History:  Diagnosis Date  . Arthritis   . Blood transfusion without reported diagnosis   . Dementia   . Hypertension   . Lupus   . Osteoporosis     Past Surgical History:  Procedure Laterality Date  . BACK SURGERY    . VASCULAR SURGERY       reports that she has never smoked. She has never used smokeless tobacco. She reports that she does not drink alcohol. Her drug history is not on file.  No Known Allergies  History reviewed. No pertinent family history.   Prior to Admission medications   Medication Sig Start Date End Date Taking? Authorizing Provider  aspirin EC 325 MG tablet Take 325 mg by mouth daily.   Yes Historical Provider, MD  diltiazem (CARDIZEM) 30 MG tablet Take 30 mg by mouth 4 (four) times daily - after meals and at bedtime.   Yes Historical Provider, MD  donepezil (ARICEPT) 10 MG tablet Take 10 mg by mouth daily.   Yes Historical Provider, MD  hydrochlorothiazide (HYDRODIURIL) 25 MG tablet Take 25 mg by mouth daily.   Yes Historical Provider, MD  memantine (NAMENDA) 10 MG tablet Take 10 mg by mouth 2 (two) times daily.   Yes Historical Provider, MD  mupirocin ointment (BACTROBAN) 2 % Apply 1 application topically 2 (two) times daily.  Applied at dressing changes 06/15/16  Yes Historical Provider, MD  potassium chloride (K-DUR) 10 MEQ tablet Take 10 mEq by mouth daily.   Yes Historical Provider, MD    Physical Exam: Vitals:   06/18/16 2200 06/18/16 2230 06/18/16 2300 06/18/16 2330  BP: 126/70 114/61 120/78 139/79  Pulse: 87  (!) 36   Resp: 20 26 24 19   Temp:      TempSrc:      SpO2: 99%  (!) 44%   Weight:      Height:          Constitutional: NAD, cachectic, chronically-ill appearing Eyes: PERTLA,  lids and conjunctivae normal ENMT: Mucous membranes are dry. Posterior pharynx clear of any exudate or lesions.   Neck: normal, supple, no masses, no thyromegaly Respiratory: clear to auscultation bilaterally, no wheezing, no crackles. No accessory muscle use.  Cardiovascular: S1 & S2 heard, regular rate and rhythm. No extremity edema. No significant JVD. Abdomen: No distension, no tenderness, no masses palpated. Bowel sounds normal.  Musculoskeletal: no clubbing / cyanosis. Deep sacral wound with foul odor, purulence, surrounding erythema and heat.    Skin: Sacral findings above; distal BLE's with hyperpigmentation in gaiter distribution. Warm, dry, well-perfused. Neurologic: Responds to painful stimuli only; no gross facial asymmetry, PERRL, Babinski down-going b/l Psychiatric: Unable to assess given pt's clinical state.     Labs on Admission: I have personally reviewed following labs and imaging studies  CBC:  Recent Labs Lab 06/18/16 2110  WBC 20.5*  NEUTROABS 18.0*  HGB 9.9*  HCT 32.0*  MCV 99.4  PLT 452*   Basic Metabolic Panel:  Recent Labs Lab 06/18/16 2110  NA 145  K 3.8  CL 105  CO2 33*  GLUCOSE 158*  BUN 37*  CREATININE 1.10*  CALCIUM 8.4*   GFR: Estimated Creatinine Clearance: 33.1 mL/min (by C-G formula based on SCr of 1.1 mg/dL (H)). Liver Function Tests:  Recent Labs Lab 06/18/16 2110  AST 66*  ALT 64*  ALKPHOS 72  BILITOT 0.3  PROT 7.9  ALBUMIN 2.4*   No results for input(s): LIPASE, AMYLASE in the last 168 hours. No results for input(s): AMMONIA in the last 168 hours. Coagulation Profile: No results for input(s): INR, PROTIME in the last 168 hours. Cardiac Enzymes: No results for input(s): CKTOTAL, CKMB, CKMBINDEX, TROPONINI in the last 168 hours. BNP (last 3 results) No results for input(s): PROBNP in the last 8760 hours. HbA1C: No results for input(s): HGBA1C in the last 72 hours. CBG: No results for input(s): GLUCAP in the last 168  hours. Lipid Profile: No results for input(s): CHOL, HDL, LDLCALC, TRIG, CHOLHDL, LDLDIRECT in the last 72 hours. Thyroid Function Tests: No results for input(s): TSH, T4TOTAL, FREET4, T3FREE, THYROIDAB in the last 72 hours. Anemia Panel: No results for input(s): VITAMINB12, FOLATE, FERRITIN, TIBC, IRON, RETICCTPCT in the last 72 hours. Urine analysis:    Component Value Date/Time   COLORURINE YELLOW 06/18/2016 2030   APPEARANCEUR CLEAR 06/18/2016 2030   LABSPEC >1.030 (H) 06/18/2016 2030   PHURINE 5.0 06/18/2016 2030   GLUCOSEU NEGATIVE 06/18/2016 2030   HGBUR NEGATIVE 06/18/2016 2030   BILIRUBINUR NEGATIVE 06/18/2016 2030   KETONESUR 15 (A) 06/18/2016 2030   PROTEINUR NEGATIVE 06/18/2016 2030   UROBILINOGEN 2.0 (H) 02/14/2010 1656   NITRITE NEGATIVE 06/18/2016 2030   LEUKOCYTESUR NEGATIVE 06/18/2016 2030   Sepsis Labs: @LABRCNTIP (procalcitonin:4,lacticidven:4) ) Recent Results (from the past 240 hour(s))  Culture, blood (Routine x 2)     Status: None (Preliminary  result)   Collection Time: 06/18/16  9:10 PM  Result Value Ref Range Status   Specimen Description BLOOD RIGHT FOREARM  Final   Special Requests BOTTLES DRAWN AEROBIC AND ANAEROBIC [facility] EACH  Final   Culture PENDING  Incomplete   Report Status PENDING  Incomplete  Culture, blood (Routine x 2)     Status: None (Preliminary result)   Collection Time: 06/18/16  9:16 PM  Result Value Ref Range Status   Specimen Description BLOOD RIGHT HAND  Final   Special Requests BOTTLES DRAWN AEROBIC AND ANAEROBIC [facility] EACH  Final   Culture PENDING  Incomplete   Report Status PENDING  Incomplete     Radiological Exams on Admission: Dg Chest 2 View  Result Date: 06/18/2016 CLINICAL DATA:  Increased weakness and anorexia. EXAM: CHEST  2 VIEW COMPARISON:  03/05/2010 FINDINGS: There is kyphosis of the mid thoracic spine with osteopenia. The lateral view was acquired with patient in the seated position as a result. There is right  upper lobe scarring. No pneumonic consolidation, pulmonary edema nor effusion. Heart is enlarged. The thoracic aorta is atherosclerotic and tortuous. Attenuated pulmonary vessels and bilateral thin linear interstitial markings may reflect areas of interstitial fibrosis and/or emphysematous hyperinflation. IMPRESSION: Cardiomegaly. Right upper lobe scarring. Mild interstitial fibrosis. Mild emphysematous hyperinflation. Electronically Signed   By: Tollie Ethavid  Kwon M.D.   On: 06/18/2016 21:32    EKG: Ordered and pending.   Assessment/Plan   1. Sepsis, suspected secondary to infected sacral decubitus ulcer  - Meets sepsis criteria on presentation - CXR no infiltrate, UA not suggestive of infxn, abd exam benign; sacral wound appears infected  - Lactate elevated to 3.04, will trend  - Blood and urine cultures incubating  - Wt-based fluids given  - Empiric vancomycin and Zosyn initiated in ED, will exchange Zosyn for carbapenem given prior sacral wound culture with MDR acinetobacter and pseudomonas   2. Dementia  - Advanced, progressing  - Continue Namenda and Aricept if appropriate for oral intake    3. Hypertension  - BP soft on admission, improved with IVF  - Managed at home with diltiazem and HCTZ, held at admission given soft BP  - Monitor and resume treatment as needed   4. Normocytic anemia - Hgb 9.9 on admission and stable relative to prior CBC's  - No s/s of active bleeding; likely secondary to chronic disease malnutrition    DVT prophylaxis: sq heparin Code Status: DNR  Family Communication: Son updated by phone Ivar Drape(Willie; [phone]) Disposition Plan: Observe on med-surg Consults called: None Admission status: Observation    Briscoe Deutscherimothy S Jakoby Melendrez, MD [facility] Pager [phone]  If 7PM-7AM, please contact night-coverage [email or web address] Password [email or web address]  06/19/2016, 12:08 AM

## 2016-06-19 NOTE — Progress Notes (Signed)
In and out catheterization performed.  600 cc of urine removed.  Pt also incontinent of small amount of urine.  Pt tolerated well.  Will continue to monitor pt.

## 2016-06-19 NOTE — Care Management Note (Addendum)
Case Management Note  Patient Details  Name: Jola Baptistlice T Strohmeyer MRN: [id number] Date of Birth: 1932/07/03  Subjective/Objective:                  Pt is from home, lives with her son and requires total care at baseline. Son at bedside and states pt is wheelchair bound and has to be fed, [facility], family provides 24/7 care. Pt does not wear oxygen PTA.  Pt's son plans on pt returning home with return of previous arrangements at DC.  Pt will need HH for wound care.  Action/Plan: Will cont to follow.   Expected Discharge Date:     06/21/2016             Expected Discharge Plan:  Home/Self Care  In-House Referral:  NA  Discharge planning Services  CM Consult  Post Acute Care Choice:  NA Choice offered to:  NA  DME Arranged:    DME Agency:     HH Arranged:    HH Agency:     Status of Service:  In process, will continue to follow  If discussed at Long Length of Stay Meetings, dates discussed:    Additional Comments:  Malcolm MetroChildress, Atalie Oros Demske, RN 06/19/2016, 9:24 AM

## 2016-06-20 ENCOUNTER — Encounter (HOSPITAL_COMMUNITY): Payer: Self-pay | Admitting: Primary Care

## 2016-06-20 DIAGNOSIS — A419 Sepsis, unspecified organism: Secondary | ICD-10-CM

## 2016-06-20 DIAGNOSIS — Z515 Encounter for palliative care: Secondary | ICD-10-CM

## 2016-06-20 DIAGNOSIS — L89300 Pressure ulcer of unspecified buttock, unstageable: Secondary | ICD-10-CM

## 2016-06-20 LAB — URINE CULTURE: Culture: NO GROWTH

## 2016-06-20 LAB — GLUCOSE, CAPILLARY: GLUCOSE-CAPILLARY: 100 mg/dL — AB (ref 65–99)

## 2016-06-20 MED ORDER — BOOST / RESOURCE BREEZE PO LIQD
1.0000 | Freq: Three times a day (TID) | ORAL | Status: DC
  Administered 2016-06-20 – 2016-06-22 (×3): 1 via ORAL

## 2016-06-20 NOTE — Progress Notes (Signed)
Initial Nutrition Assessment  INTERVENTION:  Boost Breeze po BID, each supplement provides 250 kcal and 9 grams of protein   Magic cup TID with meals, each supplement provides 290 kcal and 9 grams of protein   Assist with all meals and encourage meal intake    NUTRITION DIAGNOSIS:   Increased nutrient needs related to wound healing as evidenced by estimated needs.   GOAL: Meet pt needs as able based on health care descions     MONITOR:   PO intake, Supplement acceptance, Labs, Weight trends, Skin   Follow results of palliative assessment/ GOC  REASON FOR ASSESSMENT:   Low Braden    ASSESSMENT:  Patient is an 82 yo with hx of dementia. She lives with son. Pt presents with  FTT, decreased po intake and pt is appropriate for comfort care per MD note. She is unable to feed herself and ambulates with a wheelchair. Palliative consult pending.  Pt son is not here and hasn't been since morning per nursing. Ms Katrinka BlazingSmith breakfast and lunch intake very poor 15%- fed by staff.  Pt on  Pureed foods due to increased aspiration risk.  Unable to complete full Nutrition-Focused physical exam at this time. Pt is not awake and not responding to verbal stimulus (will not open her eyes). Her nurse today says pt is mute. Will continue to follow.   Recent Labs Lab 06/18/16 2110 06/19/16 0313  NA 145 145  K 3.8 3.3*  CL 105 109  CO2 33* 32  BUN 37* 32*  CREATININE 1.10* 0.89  CALCIUM 8.4* 7.8*  GLUCOSE 158* 122*    Labs: potassium low, BUN 32 and glucose 122  Meds: vancomycin   Diet Order:  DIET - DYS 1 Room service appropriate? Yes; Fluid consistency: Thin  Skin:   stage 4  Pressure injury to sacrum with moderate foul smelling drainage per WOC note.  Last BM:  prior to admission  Height:   Ht Readings from Last 1 Encounters:  06/19/16 5\' 6"  (1.676 m)    Weight:   Wt Readings from Last 1 Encounters:  06/19/16 136 lb 7.4 oz (61.9 kg)    Ideal Body Weight:  59 kg  BMI:   Body mass index is 22.03 kg/m.  Estimated Nutritional Needs:   Kcal:  1800-2000  Protein:  80-93 gr  Fluid:  1.8 liters daily  EDUCATION NEEDS: not appropriate at this time   Royann ShiversLynn Jorryn Hershberger MS,RD,CSG,LDN Office: #[phone] Pager: [phone]

## 2016-06-20 NOTE — Plan of Care (Signed)
Family meeting schedule 10/6 at 1000.

## 2016-06-20 NOTE — Progress Notes (Signed)
PROGRESS NOTE    Gabrielle Hopkins  WUJ:[id number] DOB: July 21, 1932 DOA: 06/18/2016 PCP: Samuel Jester, DO     Brief Narrative:  82 y/o woman admitted from home on 10/3 with FTT and an infected sacral decubitus ulcer. Has severe dementia and is a total care, tho family has been caring for her at home.   Assessment & Plan:   Principal Problem:   Sepsis affecting skin (HCC) Active Problems:   Dementia   Sacral wound, initial encounter   Hypertension   Sacral decubitus ulcer   Sepsis (HCC)   Normocytic anemia   Decubitus ulcer of buttock, unstageable (HCC)   Infected Sacral Decubitus Ulcer -Continue broad spectrum abx today. -Continue narrowing soon, especially following discussion with palliative care if they wish to pursue comfort measures.  Dementia, advanced -Total care dependant. -Per son, is non-verbal at baseline. Oral intake has been decreasing over the past months but had not had any PO intake 3 days PTA. -See below for details.  FTT -Mainly due to advanced dementia. -Have discussed prognosis and quality of life issues with son Ivar Drape. He is open to a discussion with palliative care altho most of our conversation comes as a surprise to him.   DVT prophylaxis: SQ heparin Code Status: DNR Family Communication: Son Ivar Drape at bedside updated on plan of care and all questions answered Disposition Plan: To be determined, would anticipate DC home +/- hospice services  Consultants:   Palliative care  Procedures:   None  Antimicrobials:   Vancomycin  Zosyn    Subjective: Lying in bed, unresponsive  Objective: Vitals:   06/19/16 0624 06/19/16 1431 06/19/16 2132 06/20/16 0445  BP: 140/70 127/68 123/69 126/70  Pulse: (!) 45 60 91 84  Resp: 18 20 20 20   Temp: 97.2 F (36.2 C)  98.8 F (37.1 C) 97.3 F (36.3 C)  TempSrc: Oral  Oral Oral  SpO2: 100% 100% 100% 100%  Weight: 61.9 kg (136 lb 7.4 oz)     Height:        Intake/Output Summary (Last 24  hours) at 06/20/16 1352 Last data filed at 06/20/16 0600  Gross per 24 hour  Intake             1025 ml  Output              300 ml  Net              725 ml   Filed Weights   06/18/16 2126 06/19/16 0028 06/19/16 0624  Weight: 52.2 kg (115 lb) 61 kg (134 lb 7.7 oz) 61.9 kg (136 lb 7.4 oz)    Examination:  General exam: unresponsive, non-verbal Respiratory system: Clear to auscultation. Respiratory effort normal. Cardiovascular system:RRR. No murmurs, rubs, gallops. Gastrointestinal system: Abdomen is nondistended, soft and nontender. No organomegaly or masses felt. Normal bowel sounds heard. Central nervous system: Unable to fully assess given current mental state Extremities: 1+ edema bilaterally Skin: No rashes, lesions or ulcers Psychiatry: Unable to assess given current mental state    Data Reviewed: I have personally reviewed following labs and imaging studies  CBC:  Recent Labs Lab 06/18/16 2110 06/19/16 0313  WBC 20.5* 16.7*  NEUTROABS 18.0* 14.4*  HGB 9.9* 9.3*  HCT 32.0* 29.9*  MCV 99.4 98.4  PLT 452* 337   Basic Metabolic Panel:  Recent Labs Lab 06/18/16 2110 06/19/16 0313  NA 145 145  K 3.8 3.3*  CL 105 109  CO2 33* 32  GLUCOSE 158* 122*  BUN 37* 32*  CREATININE 1.10* 0.89  CALCIUM 8.4* 7.8*   GFR: Estimated Creatinine Clearance: 46.4 mL/min (by C-G formula based on SCr of 0.89 mg/dL). Liver Function Tests:  Recent Labs Lab 06/18/16 2110 06/19/16 0313  AST 66* 43*  ALT 64* 50  ALKPHOS 72 61  BILITOT 0.3 0.5  PROT 7.9 6.8  ALBUMIN 2.4* 2.1*   No results for input(s): LIPASE, AMYLASE in the last 168 hours. No results for input(s): AMMONIA in the last 168 hours. Coagulation Profile:  Recent Labs Lab 06/18/16 2110  INR 1.22   Cardiac Enzymes: No results for input(s): CKTOTAL, CKMB, CKMBINDEX, TROPONINI in the last 168 hours. BNP (last 3 results) No results for input(s): PROBNP in the last 8760 hours. HbA1C: No results for  input(s): HGBA1C in the last 72 hours. CBG:  Recent Labs Lab 06/19/16 0712 06/20/16 0739  GLUCAP 123* 100*   Lipid Profile: No results for input(s): CHOL, HDL, LDLCALC, TRIG, CHOLHDL, LDLDIRECT in the last 72 hours. Thyroid Function Tests: No results for input(s): TSH, T4TOTAL, FREET4, T3FREE, THYROIDAB in the last 72 hours. Anemia Panel: No results for input(s): VITAMINB12, FOLATE, FERRITIN, TIBC, IRON, RETICCTPCT in the last 72 hours. Urine analysis:    Component Value Date/Time   COLORURINE YELLOW 06/18/2016 2030   APPEARANCEUR CLEAR 06/18/2016 2030   LABSPEC >1.030 (H) 06/18/2016 2030   PHURINE 5.0 06/18/2016 2030   GLUCOSEU NEGATIVE 06/18/2016 2030   HGBUR NEGATIVE 06/18/2016 2030   BILIRUBINUR NEGATIVE 06/18/2016 2030   KETONESUR 15 (A) 06/18/2016 2030   PROTEINUR NEGATIVE 06/18/2016 2030   UROBILINOGEN 2.0 (H) 02/14/2010 1656   NITRITE NEGATIVE 06/18/2016 2030   LEUKOCYTESUR NEGATIVE 06/18/2016 2030   Sepsis Labs: @LABRCNTIP (procalcitonin:4,lacticidven:4)  ) Recent Results (from the past 240 hour(s))  Urine culture     Status: None   Collection Time: 06/18/16  8:30 PM  Result Value Ref Range Status   Specimen Description URINE, CLEAN CATCH  Final   Special Requests NONE  Final   Culture NO GROWTH Performed at [facility]   Final   Report Status 06/20/2016 FINAL  Final  Culture, blood (Routine x 2)     Status: None (Preliminary result)   Collection Time: 06/18/16  9:10 PM  Result Value Ref Range Status   Specimen Description BLOOD RIGHT FOREARM  Final   Special Requests BOTTLES DRAWN AEROBIC AND ANAEROBIC 6CC EACH  Final   Culture NO GROWTH 2 DAYS  Final   Report Status PENDING  Incomplete  Culture, blood (Routine x 2)     Status: None (Preliminary result)   Collection Time: 06/18/16  9:16 PM  Result Value Ref Range Status   Specimen Description BLOOD RIGHT HAND  Final   Special Requests BOTTLES DRAWN AEROBIC AND ANAEROBIC 6CC EACH  Final    Culture NO GROWTH 2 DAYS  Final   Report Status PENDING  Incomplete         Radiology Studies: Dg Chest 2 View  Result Date: 06/18/2016 CLINICAL DATA:  Increased weakness and anorexia. EXAM: CHEST  2 VIEW COMPARISON:  03/05/2010 FINDINGS: There is kyphosis of the mid thoracic spine with osteopenia. The lateral view was acquired with patient in the seated position as a result. There is right upper lobe scarring. No pneumonic consolidation, pulmonary edema nor effusion. Heart is enlarged. The thoracic aorta is atherosclerotic and tortuous. Attenuated pulmonary vessels and bilateral thin linear interstitial markings may reflect areas of interstitial fibrosis and/or emphysematous hyperinflation. IMPRESSION: Cardiomegaly. Right upper lobe  scarring. Mild interstitial fibrosis. Mild emphysematous hyperinflation. Electronically Signed   By: Tollie Ethavid  Kwon M.D.   On: 06/18/2016 21:32        Scheduled Meds: . aspirin EC  325 mg Oral Daily  . donepezil  10 mg Oral Daily  . heparin  5,000 Units Subcutaneous Q8H  . imipenem-cilastatin  500 mg Intravenous Q8H  . memantine  10 mg Oral BID  . sodium chloride  750 mL Intravenous Once  . sodium hypochlorite   Irrigation BID  . vancomycin  500 mg Intravenous Q12H   Continuous Infusions: . sodium chloride 75 mL/hr at 06/19/16 1615     LOS: 1 day    Time spent: 25 minutes. Greater than 50% of this time was spent in direct contact with the patient coordinating care.     Chaya JanHERNANDEZ ACOSTA,ESTELA, MD [facility] Pager [phone]  If 7PM-7AM, please contact night-coverage [email or web address] Password [email or web address] 06/20/2016, 1:52 PM

## 2016-06-21 ENCOUNTER — Encounter (HOSPITAL_COMMUNITY): Payer: Self-pay | Admitting: Primary Care

## 2016-06-21 DIAGNOSIS — Z66 Do not resuscitate: Secondary | ICD-10-CM

## 2016-06-21 DIAGNOSIS — Z7189 Other specified counseling: Secondary | ICD-10-CM

## 2016-06-21 DIAGNOSIS — F0390 Unspecified dementia without behavioral disturbance: Secondary | ICD-10-CM

## 2016-06-21 LAB — GLUCOSE, CAPILLARY
Glucose-Capillary: 103 mg/dL — ABNORMAL HIGH (ref 65–99)
Glucose-Capillary: 100 mg/dL — ABNORMAL HIGH (ref 65–99)

## 2016-06-21 MED ORDER — MORPHINE SULFATE (CONCENTRATE) 10 MG/0.5ML PO SOLN
2.5000 mg | ORAL | Status: DC | PRN
  Administered 2016-06-22: 2.6 mg via SUBLINGUAL
  Filled 2016-06-21: qty 0.5

## 2016-06-21 NOTE — Care Management (Signed)
Gabrielle Hopkins, Gabrielle Hopkins #[id number] (CSN: [id number]) (82 y.o. F) (Adm: 06/18/16)  [id number]   PCP  BUTLER, CYNTHIA   Demographics  Comment    Last edited by  on at   Address: Home Phone: Work Phone: Mobile Phone:  [address]  [place] [id number]   [phone] -- --  SSN: Insurance: Marital Status: Religion:  [id number] [place] Married None  Patient Ethnicity & Race   Ethnic Group Patient Race  Not Hispanic or Latino Black or African American  Documents Filed to Patient   Power of Attorney Living Will Clinical Unknown Study Attachment Consent Form ABN Waiver After Visit Summary Lab Result Scan Code Status MyChart Status Advance Care Planning  Not on File Not on File Not on File Not on File Filed Not on File Not on File Filed DNR [Updated on 06/19/16 0011] Inactive Jump to the Activity  Admission Information   Attending Provider Admitting Provider Admission Type Admission Date/Time  Henderson CloudEstela Y Hernandez Acosta, MD Briscoe Deutscherimothy S Opyd, MD Emergency 06/18/16 2044  Discharge Date Hospital Service Auth/Cert Status Service Area   Internal Medicine Incomplete [facility] SERVICE AREA  Unit Room/Bed Admission Status   AP-DEPT 300 A306/A306-01 Admission (Confirmed)   Hospital Account   Name Acct ID Class Status Primary Coverage  Gabrielle Hopkins, Gabrielle Hopkins [id number] Inpatient Open [facility]IC[facility]      Guarantor Account (for Hospital Account [id number])   Name Relation to Pt Service Area Active? Acct Type  Tyrone AppleSmith, Gabrielle Hopkins Self CHSA Yes Personal/Family  Address Phone    [address] [place] [address] [phone](H)        Coverage Information (for Hospital Account [id number])   F/O Payor/Plan Precert #  [facility]   Subscriber Subscriber #  Gabrielle Hopkins, Gabrielle Hopkins [id number]  Address Phone  [address] [place], [place] [phone]

## 2016-06-21 NOTE — Care Management Important Message (Signed)
Important Message  Patient Details  Name: Gabrielle Hopkins MRN: [id number] Date of Birth: 01-28-1932   Medicare Important Message Given:  Yes    Malcolm MetroChildress, Meilani Edmundson Demske, RN 06/21/2016, 7:42 AM

## 2016-06-21 NOTE — Progress Notes (Signed)
PROGRESS NOTE    Gabrielle Hopkins  ZOX:[id number] DOB: 28-Aug-1932 DOA: 06/18/2016 PCP: Samuel JesterYNTHIA BUTLER, DO     Brief Narrative:  82 y/o woman admitted from home on 10/3 with FTT and an infected sacral decubitus ulcer. Has severe dementia and is a total care, tho family has been caring for her at home.   Assessment & Plan:   Principal Problem:   Sepsis affecting skin (HCC) Active Problems:   Dementia   Sacral wound, initial encounter   Hypertension   Sacral decubitus ulcer   Sepsis (HCC)   Normocytic anemia   Decubitus ulcer of buttock, unstageable (HCC)   Palliative care encounter   Infected Sacral Decubitus Ulcer -Continue broad spectrum abx today. -Continue narrowing soon, especially following discussion with palliative care if they wish to pursue comfort measures.  Dementia, advanced -Total care dependant. -Per son, is non-verbal at baseline. Oral intake has been decreasing over the past months but had not had any PO intake 3 days PTA. -See below for details.  FTT -Mainly due to advanced dementia. -Have discussed prognosis and quality of life issues with son Ivar DrapeWillie. He is open to a discussion with palliative care altho most of our conversation comes as a surprise to him.   DVT prophylaxis: SQ heparin Code Status: DNR Family Communication: Son Ivar DrapeWillie at bedside updated on plan of care and all questions answered Disposition Plan: To be determined, would anticipate DC home +/- hospice services  Consultants:   Palliative care  Procedures:   None  Antimicrobials:   Vancomycin  Zosyn    Subjective: Lying in bed, unresponsive  Objective: Vitals:   06/20/16 1418 06/20/16 2159 06/21/16 0445 06/21/16 0956  BP: 122/67 122/68 119/79   Pulse: 90 97 89   Resp: 18 18 18    Temp: 98.6 F (37 C) 98.4 F (36.9 C) 98.2 F (36.8 C)   TempSrc: Oral Oral Oral   SpO2: 99% 99% 99% 97%  Weight:   60 kg (132 lb 3.2 oz)   Height:        Intake/Output Summary (Last  24 hours) at 06/21/16 1432 Last data filed at 06/21/16 1030  Gross per 24 hour  Intake              440 ml  Output              600 ml  Net             -160 ml   Filed Weights   06/19/16 0028 06/19/16 0624 06/21/16 0445  Weight: 61 kg (134 lb 7.7 oz) 61.9 kg (136 lb 7.4 oz) 60 kg (132 lb 3.2 oz)    Examination:  General exam: unresponsive, non-verbal Respiratory system: Clear to auscultation. Respiratory effort normal. Cardiovascular system:RRR. No murmurs, rubs, gallops. Gastrointestinal system: Abdomen is nondistended, soft and nontender. No organomegaly or masses felt. Normal bowel sounds heard. Central nervous system: Unable to fully assess given current mental state Extremities: 1+ edema bilaterally Skin: No rashes, lesions or ulcers Psychiatry: Unable to assess given current mental state    Data Reviewed: I have personally reviewed following labs and imaging studies  CBC:  Recent Labs Lab 06/18/16 2110 06/19/16 0313  WBC 20.5* 16.7*  NEUTROABS 18.0* 14.4*  HGB 9.9* 9.3*  HCT 32.0* 29.9*  MCV 99.4 98.4  PLT 452* 337   Basic Metabolic Panel:  Recent Labs Lab 06/18/16 2110 06/19/16 0313  NA 145 145  K 3.8 3.3*  CL 105 109  CO2 33*  32  GLUCOSE 158* 122*  BUN 37* 32*  CREATININE 1.10* 0.89  CALCIUM 8.4* 7.8*   GFR: Estimated Creatinine Clearance: 46.4 mL/min (by C-G formula based on SCr of 0.89 mg/dL). Liver Function Tests:  Recent Labs Lab 06/18/16 2110 06/19/16 0313  AST 66* 43*  ALT 64* 50  ALKPHOS 72 61  BILITOT 0.3 0.5  PROT 7.9 6.8  ALBUMIN 2.4* 2.1*   No results for input(s): LIPASE, AMYLASE in the last 168 hours. No results for input(s): AMMONIA in the last 168 hours. Coagulation Profile:  Recent Labs Lab 06/18/16 2110  INR 1.22   Cardiac Enzymes: No results for input(s): CKTOTAL, CKMB, CKMBINDEX, TROPONINI in the last 168 hours. BNP (last 3 results) No results for input(s): PROBNP in the last 8760 hours. HbA1C: No results  for input(s): HGBA1C in the last 72 hours. CBG:  Recent Labs Lab 06/19/16 0712 06/20/16 0739 06/21/16 0721 06/21/16 1145  GLUCAP 123* 100* 103* 100*   Lipid Profile: No results for input(s): CHOL, HDL, LDLCALC, TRIG, CHOLHDL, LDLDIRECT in the last 72 hours. Thyroid Function Tests: No results for input(s): TSH, T4TOTAL, FREET4, T3FREE, THYROIDAB in the last 72 hours. Anemia Panel: No results for input(s): VITAMINB12, FOLATE, FERRITIN, TIBC, IRON, RETICCTPCT in the last 72 hours. Urine analysis:    Component Value Date/Time   COLORURINE YELLOW 06/18/2016 2030   APPEARANCEUR CLEAR 06/18/2016 2030   LABSPEC >1.030 (H) 06/18/2016 2030   PHURINE 5.0 06/18/2016 2030   GLUCOSEU NEGATIVE 06/18/2016 2030   HGBUR NEGATIVE 06/18/2016 2030   BILIRUBINUR NEGATIVE 06/18/2016 2030   KETONESUR 15 (A) 06/18/2016 2030   PROTEINUR NEGATIVE 06/18/2016 2030   UROBILINOGEN 2.0 (H) 02/14/2010 1656   NITRITE NEGATIVE 06/18/2016 2030   LEUKOCYTESUR NEGATIVE 06/18/2016 2030   Sepsis Labs: @LABRCNTIP (procalcitonin:4,lacticidven:4)  ) Recent Results (from the past 240 hour(s))  Urine culture     Status: None   Collection Time: 06/18/16  8:30 PM  Result Value Ref Range Status   Specimen Description URINE, CLEAN CATCH  Final   Special Requests NONE  Final   Culture NO GROWTH Performed at [facility]   Final   Report Status 06/20/2016 FINAL  Final  Culture, blood (Routine x 2)     Status: None (Preliminary result)   Collection Time: 06/18/16  9:10 PM  Result Value Ref Range Status   Specimen Description BLOOD RIGHT FOREARM  Final   Special Requests BOTTLES DRAWN AEROBIC AND ANAEROBIC 6CC EACH  Final   Culture NO GROWTH 3 DAYS  Final   Report Status PENDING  Incomplete  Culture, blood (Routine x 2)     Status: None (Preliminary result)   Collection Time: 06/18/16  9:16 PM  Result Value Ref Range Status   Specimen Description BLOOD RIGHT HAND  Final   Special Requests BOTTLES DRAWN  AEROBIC AND ANAEROBIC 6CC EACH  Final   Culture NO GROWTH 3 DAYS  Final   Report Status PENDING  Incomplete         Radiology Studies: No results found.      Scheduled Meds: . aspirin EC  325 mg Oral Daily  . donepezil  10 mg Oral Daily  . feeding supplement  1 Container Oral TID BM  . heparin  5,000 Units Subcutaneous Q8H  . imipenem-cilastatin  500 mg Intravenous Q8H  . memantine  10 mg Oral BID  . sodium hypochlorite   Irrigation BID  . vancomycin  500 mg Intravenous Q12H   Continuous Infusions: . sodium  chloride 75 mL/hr at 06/19/16 1615     LOS: 2 days    Time spent: 25 minutes. Greater than 50% of this time was spent in direct contact with the patient coordinating care.     Chaya Jan, MD [facility] Pager [phone]  If 7PM-7AM, please contact night-coverage [email or web address] Password [email or web address] 06/21/2016, 2:32 PM

## 2016-06-21 NOTE — Consult Note (Signed)
Consultation Note Date: 06/21/2016   Patient Name: Gabrielle Hopkins  DOB: May 08, 1932  MRN: [id number]  Age / Sex: 82 y.o., female  PCP: Gabrielle Jesterynthia Butler, DO Referring Physician: Micael HampshireEstela Y Hernandez Hopkins*  Reason for Consultation: Establishing goals of care, Hospice Evaluation and Psychosocial/spiritual support  HPI/Patient Profile: 82 y.o. female  with past medical history of Arthritis, hypertension, lupus, dementia, history of back and vascular surgeries admitted on 06/18/2016 with sepsis is scanned secondary to infected sacral ulcer.   Clinical Assessment and Goals of Care: Gabrielle Hopkins is lying in bed with nursing staff providing perineal care. She does not communicate in any meaningful way. Son Gabrielle Hopkins is in the hallway, and he and I returned to my office for a family meeting. We schedule a family meeting with he and his 2 sisters, Gabrielle Hopkins and Gabrielle Hopkins, at 2 PM. Gabrielle Hopkins and I talk about Gabrielle Hopkins's life, her husband died with Parkenson's (in hospice home), around 2014. They have 24/7 care for Mrs. Katrinka Hopkins, in her home [facility](Gabrielle Hopkins stays overnight).   We discuss the dementia trajectory, and expected changes such as decreased mobility.  Gabrielle Hopkins shares a story about Gabrielle Hopkins's back surgery, she is basically bed-bound, maximum assist at this time. We also talk about decrease in food intake. Gabrielle Hopkins shares that they now chop her food, and she is no longer able to feed herself, For the last 6 to 8 months. We also talk about risk for infections such as pneumonia and UTI, and now her severe sacral wound.  Gabrielle Hopkins states she is had this wound for 3 to 4 months. Gabrielle Hopkins states that he has not noticed any weight loss, but does admit that she is eating less food. We talk about logistics such as healthcare power of attorney (see below).  We also talk about advanced directives, he states that his desire is to allow a natural death. We also talk about  where Mrs. Katrinka Hopkins wants to be when her time comes to die. He states that he's not sure.    Family meeting held later in the afternoon with CazenoviaWillie, [place], [place] and her husband Gabrielle Hopkins.  We again talk about the dementia trajectory and expected changes. I share a diagram of the chronic illness pathway. We also talk about the benefits of home health versus in-home hospice.  We review labs. Daughter Gabrielle Hopkins states that her goal is for her mother to be made comfortable. We talk about nonaggressive wound care with hospice, we also talk about medications their benefits and burdens. Family states that their preferences for Mrs. Katrinka Hopkins to return home with the benefits of hospice of [facility], but they are unsure, at this time, if they will return her to her own home or daughter Gabrielle Hopkins's home. They have needed equipment at her home, but will need equipment if she is to discharge to Gabrielle Hopkins's home.  All questions answered no other concerns at this time. Family states they need some time to talk amongst themselves to decide disposition.    Health care power of atty NEXT  OF KIN - family makes decisions together. Gabrielle Hopkins, is main Management consultant.    SUMMARY OF RECOMMENDATIONS   at this point, families desire is to continue treatment for wound infection, okay to transition from IV to PO antibiotics. Return to home with the benefits of hospice in [facility], possibly need for hospice home in the future.  We discuss/ family understands about "nonaggressive wound care" will be provided by hospice.  Code Status/Advance Care Planning:  DNR  Symptom Management:   Per hospitalist   Palliative Prophylaxis:   Aspiration, Frequent Pain Assessment, Palliative Wound Care and Turn Reposition  Additional Recommendations (Limitations, Scope, Preferences):  Return home with the benefits of hospice of [facility]. Complete course of antibiotics, nonaggressive wound care, focus on comfort, keep  indwelling Foley catheter.  Psycho-social/Spiritual:   Desire for further Chaplaincy support:no  Additional Recommendations: Caregiving  Support/Resources and Education on Hospice  Prognosis:   < 6 weeks, or less likely based on advanced severe dementia, failure to thrive, severe sacral wound, low albumin of 2.1.  Discharge Planning: Home with hospice of [facility], possible need for hospice home in the future.      Primary Diagnoses: Present on Admission: . Sepsis affecting skin (HCC) . Dementia . Sacral wound, initial encounter . Hypertension . Sacral decubitus ulcer . Sepsis (HCC) . Normocytic anemia   I have reviewed the medical record, interviewed the patient and family, and examined the patient. The following aspects are pertinent.  Past Medical History:  Diagnosis Date  . Arthritis   . Blood transfusion without reported diagnosis   . Dementia   . Hypertension   . Lupus   . Osteoporosis    Social History   Social History  . Marital status: Married    Spouse name: N/A  . Number of children: N/A  . Years of education: N/A   Social History Main Topics  . Smoking status: Never Smoker  . Smokeless tobacco: Never Used  . Alcohol use No  . Drug use: Unknown  . Sexual activity: Not Asked   Other Topics Concern  . None   Social History Narrative  . None   History reviewed. No pertinent family history. Scheduled Meds: . aspirin EC  325 mg Oral Daily  . donepezil  10 mg Oral Daily  . feeding supplement  1 Container Oral TID BM  . heparin  5,000 Units Subcutaneous Q8H  . imipenem-cilastatin  500 mg Intravenous Q8H  . memantine  10 mg Oral BID  . sodium hypochlorite   Irrigation BID  . vancomycin  500 mg Intravenous Q12H   Continuous Infusions: . sodium chloride 75 mL/hr at 06/19/16 1615   PRN Meds:.acetaminophen **OR** acetaminophen, bisacodyl, ondansetron **OR** ondansetron (ZOFRAN) IV Medications Prior to Admission:  Prior to Admission  medications   Medication Sig Start Date End Date Taking? Authorizing Provider  aspirin EC 325 MG tablet Take 325 mg by mouth daily.   Yes Historical Provider, MD  diltiazem (CARDIZEM) 30 MG tablet Take 30 mg by mouth 4 (four) times daily - after meals and at bedtime.   Yes Historical Provider, MD  donepezil (ARICEPT) 10 MG tablet Take 10 mg by mouth daily.   Yes Historical Provider, MD  hydrochlorothiazide (HYDRODIURIL) 25 MG tablet Take 25 mg by mouth daily.   Yes Historical Provider, MD  memantine (NAMENDA) 10 MG tablet Take 10 mg by mouth 2 (two) times daily.   Yes Historical Provider, MD  mupirocin ointment (BACTROBAN) 2 % Apply 1  application topically 2 (two) times daily. Applied at dressing changes 06/15/16  Yes Historical Provider, MD  potassium chloride (K-DUR) 10 MEQ tablet Take 10 mEq by mouth daily.   Yes Historical Provider, MD   No Known Allergies Review of Systems  Unable to perform ROS: Dementia    Physical Exam  Constitutional: No distress.  Thin and frail, chronically ill appearing.   HENT:  Head: Normocephalic and atraumatic.  temporal wasting  Cardiovascular: Normal rate and regular rhythm.   Pulmonary/Chest: Effort normal. No respiratory distress.  Abdominal: Soft. She exhibits no distension.  Neurological:  No verbal response  Skin: Skin is warm and dry.  Severe sacral wound  Nursing note and vitals reviewed.   Vital Signs: BP 119/79 (BP Location: Left Arm)   Pulse 89   Temp 98.2 F (36.8 C) (Oral)   Resp 18   Ht 5\' 6"  (1.676 m)   Wt 60 kg (132 lb 3.2 oz)   SpO2 97%   BMI 21.34 kg/m  Pain Assessment: PAINAD       SpO2: SpO2: 97 % O2 Device:SpO2: 97 % O2 Flow Rate: .O2 Flow Rate (L/min): 2 L/min  IO: Intake/output summary:  Intake/Output Summary (Last 24 hours) at 06/21/16 1121 Last data filed at 06/21/16 1030  Gross per 24 hour  Intake              440 ml  Output              600 ml  Net             -160 ml    LBM: Last BM Date: (P)  06/21/16 Baseline Weight: Weight: 86.2 kg (190 lb) Most recent weight: Weight: 60 kg (132 lb 3.2 oz)     Palliative Assessment/Data:   Flowsheet Rows   Flowsheet Row Most Recent Value  Intake Tab  Referral Department  Hospitalist  Unit at Time of Referral  Med/Surg Unit  Palliative Care Primary Diagnosis  Sepsis/Infectious Disease  Date Notified  06/19/16  Palliative Care Type  New Palliative care  Reason for referral  Clarify Goals of Care, End of Life Care Assistance  Date of Admission  06/18/16  Date first seen by Palliative Care  06/20/16  # of days Palliative referral response time  1 Day(s)  # of days IP prior to Palliative referral  1  Clinical Assessment  Palliative Performance Scale Score  20%  Pain Max last 24 hours  Not able to report  Pain Min Last 24 hours  Not able to report  Dyspnea Max Last 24 Hours  Not able to report  Dyspnea Min Last 24 hours  Not able to report  Psychosocial & Spiritual Assessment  Palliative Care Outcomes  Patient/Family meeting held?  No [Scheduled family conference for 10/6 with son via phone]  Patient/Family wishes: Interventions discontinued/not started   Mechanical Ventilation  Palliative Care follow-up planned  -- [Follow-up while at [facility]]      Time In: 1010  -  1415 Time Out: 1100  -  1500 Time Total: 50 -  45  = 95 minutes  Goals of care/POC discussed with nursing staff, case manager, and Dr. Viann Fish. Greater than 50%  of this time was spent counseling and coordinating care related to the above assessment and plan.  Signed by: Katheran Awe, NP   Please contact Palliative Medicine Team phone at [phone] for questions and concerns.  For individual provider: See Loretha Stapler

## 2016-06-21 NOTE — Care Management Note (Addendum)
Case Management Note  Patient Details  Name: REY FORS MRN: [id number] Date of Birth: August 28, 1932     Expected Discharge Date:       06/22/2016           Expected Discharge Plan:  [place]  In-House Referral:  NA  Discharge planning Services  CM Consult  Post Acute Care Choice:    Choice offered to:     DME Arranged:    DME Agency:     HH Arranged:    [place] Agency:     Status of Service:  Completed, signed off  If discussed at [employer] of Stay Meetings, dates discussed:    Additional Comments: Patient's family have met with palliative care and elected for hospice care at home. They have chosen [facility]. I have spoke with Larena Glassman of [facility] and faxed over referral information. Patient will be discharged home tomorrow.   Spoke with Horris Latino or [facility], she will speak with patient's family regarding equipment needs and is aware patient will be discharging tomorrow.  Jericca Russett, Chauncey Reading, RN 06/21/2016, 3:28 PM

## 2016-06-21 NOTE — Care Management Note (Signed)
Case Management Note  Patient Details  Name: Gabrielle Hopkins MRN: [id number] Date of Birth: October 03, 1931   Expected Discharge Date:     06/25/2016             Expected Discharge Plan:  Home w Home Health Services  In-House Referral:  Palliative  Discharge planning Services  CM Consult  Post Acute Care Choice:    Choice offered to:     DME Arranged:    DME Agency:     HH Arranged:    HH Agency:     Status of Service:  In process, will continue to follow  If discussed at Long Length of Stay Meetings, dates discussed:    Additional Comments: Palliative consult pending. [facility]. If pt's family choses HH they would like [facility].   Malcolm Metrohildress, Aizley Stenseth Demske, RN 06/21/2016, 10:52 AM

## 2016-06-22 LAB — BASIC METABOLIC PANEL
ANION GAP: 7 (ref 5–15)
BUN: 17 mg/dL (ref 6–20)
CHLORIDE: 110 mmol/L (ref 101–111)
CO2: 28 mmol/L (ref 22–32)
Calcium: 7.6 mg/dL — ABNORMAL LOW (ref 8.9–10.3)
Creatinine, Ser: 0.69 mg/dL (ref 0.44–1.00)
GFR calc Af Amer: >60 mL/min (ref >60)
GLUCOSE: 111 mg/dL — AB (ref 65–99)
POTASSIUM: 3.4 mmol/L — AB (ref 3.5–5.1)
Sodium: 145 mmol/L (ref 135–145)

## 2016-06-22 LAB — GLUCOSE, CAPILLARY: Glucose-Capillary: 117 mg/dL — ABNORMAL HIGH (ref 65–99)

## 2016-06-22 MED ORDER — PNEUMOCOCCAL VAC POLYVALENT 25 MCG/0.5ML IJ INJ
0.5000 mL | INJECTION | Freq: Once | INTRAMUSCULAR | Status: AC
  Administered 2016-06-22: 0.5 mL via INTRAMUSCULAR
  Filled 2016-06-22: qty 0.5

## 2016-06-22 MED ORDER — INFLUENZA VAC SPLIT QUAD 0.5 ML IM SUSY
0.5000 mL | PREFILLED_SYRINGE | Freq: Once | INTRAMUSCULAR | Status: AC
  Administered 2016-06-22: 0.5 mL via INTRAMUSCULAR
  Filled 2016-06-22: qty 0.5

## 2016-06-22 MED ORDER — MORPHINE SULFATE (CONCENTRATE) 10 MG/0.5ML PO SOLN: 2.5000 mg | ORAL | 0 refills | Status: AC | PRN

## 2016-06-22 NOTE — Discharge Summary (Addendum)
Physician Discharge Summary  Gabrielle Hopkins GHW:[id number] DOB: 1932-04-22 DOA: 06/18/2016  PCP: Octavio Graves, DO  Admit date: 06/18/2016 Discharge date: 06/22/2016  Time spent: 45 minutes  Recommendations for Outpatient Follow-up:  -Will be discharged home today with hospice services.   Discharge Diagnoses:  Active Problems:   Dementia   Sacral wound, initial encounter   Hypertension   Sacral decubitus ulcer   Sepsis ([place])   Normocytic anemia   Decubitus ulcer of buttock, unstageable ([place])   Palliative care encounter   Goals of care, counseling/discussion   DNR (do not resuscitate)   Discharge Condition: Stable and improved  Filed Weights   06/19/16 0028 06/19/16 0624 06/21/16 0445  Weight: 61 kg (134 lb 7.7 oz) 61.9 kg (136 lb 7.4 oz) 60 kg (132 lb 3.2 oz)    History of present illness:  As per Dr. Myna Hidalgo on 10/4:  Gabrielle Hopkins is a 82 y.o. female with medical history significant for hypertension, advanced dementia, and sacral ulcer who presents to the emergency department, brought in by family for evaluation of her failure to eat for the past 2 days. At the patient's baseline, she is not verbal and does not stand on her own. She is typically spoonfed by her family with whom she lives, but has not opened her mouth to eat for the past 2 days. Family also notes that she has a wound at the sacrum with home health providing dressing changes. Patient is not known to voice any specific complaint, she has not appeared to family to be in distress or discomfort, and has not been noted to have a cough, vomiting, or diarrhea. Patient has not taken her medications for the past 2 days as they're typically given with her food. There is been no melena or hematochezia noted.   ED Course: Upon arrival to the ED, patient is found to be afebrile, saturating well on room air, tachypneic in the 30s, tachycardic in the low 100s, and with stable blood pressure. Chest x-ray is consistent with  emphysematous changes and cardiomegaly, but negative for acute cardiopulmonary disease. CMP features a BUN of 37 and stable serum creatinine at 1.10. CBC features a leukocytosis to 20,500, thrombocytosis to 452,000, and a stable normocytic anemia with hemoglobin of 9.9. Urinalysis is notable for elevated specific gravity, but not suggestive of infection. Lactic acid returned elevated to a value of 3.04. Blood and urine cultures were obtained, 1 L of normal saline was given as a bolus, and empiric treatment with vancomycin and Zosyn was initiated in the emergency department. Tachycardia resolved with fluids and blood pressure remained stable. Patient will be observed on the medical/surgical unit for ongoing evaluation and management of sepsis suspected secondary to infected sacral ulcer.  Hospital Course:   Sacral Decubitus Ulcer Stage IV -Has completed abx therapy -Will be discharged home today with hospice services. -Sepsis not present.  Dementia, advanced -Total care dependant. -Per son, is non-verbal at baseline. Oral intake has been decreasing over the past months but had not had any PO intake 3 days PTA. -See below for details.  FTT -Mainly due to advanced dementia. -Have discussed prognosis and quality of life issues with son Gabrielle Hopkins.  -family has met with palliative care and they have elected to pursue comfort measures only.  Procedures:  None   Consultations:  Palliative Care  Discharge Instructions     Medication List    STOP taking these medications   aspirin EC 325 MG tablet  diltiazem 30 MG tablet Commonly known as:  CARDIZEM   hydrochlorothiazide 25 MG tablet Commonly known as:  HYDRODIURIL   potassium chloride 10 MEQ tablet Commonly known as:  K-DUR     TAKE these medications   donepezil 10 MG tablet Commonly known as:  ARICEPT Take 10 mg by mouth daily.   memantine 10 MG tablet Commonly known as:  NAMENDA Take 10 mg by mouth 2 (two) times daily.    morphine CONCENTRATE 10 MG/0.5ML Soln concentrated solution Place 0.13 mLs (2.6 mg total) under the tongue every 2 (two) hours as needed for moderate pain or severe pain.   mupirocin ointment 2 % Commonly known as:  BACTROBAN Apply 1 application topically 2 (two) times daily. Applied at dressing changes      No Known Allergies    The results of significant diagnostics from this hospitalization (including imaging, microbiology, ancillary and laboratory) are listed below for reference.    Significant Diagnostic Studies: Dg Chest 2 View  Result Date: 06/18/2016 CLINICAL DATA:  Increased weakness and anorexia. EXAM: CHEST  2 VIEW COMPARISON:  03/05/2010 FINDINGS: There is kyphosis of the mid thoracic spine with osteopenia. The lateral view was acquired with patient in the seated position as a result. There is right upper lobe scarring. No pneumonic consolidation, pulmonary edema nor effusion. Heart is enlarged. The thoracic aorta is atherosclerotic and tortuous. Attenuated pulmonary vessels and bilateral thin linear interstitial markings may reflect areas of interstitial fibrosis and/or emphysematous hyperinflation. IMPRESSION: Cardiomegaly. Right upper lobe scarring. Mild interstitial fibrosis. Mild emphysematous hyperinflation. Electronically Signed   By: Ashley Royalty M.D.   On: 06/18/2016 21:32    Microbiology: Recent Results (from the past 240 hour(s))  Urine culture     Status: None   Collection Time: 06/18/16  8:30 PM  Result Value Ref Range Status   Specimen Description URINE, CLEAN CATCH  Final   Special Requests NONE  Final   Culture NO GROWTH Performed at [facility]   Final   Report Status 06/20/2016 FINAL  Final  Culture, blood (Routine x 2)     Status: None (Preliminary result)   Collection Time: 06/18/16  9:10 PM  Result Value Ref Range Status   Specimen Description BLOOD RIGHT FOREARM  Final   Special Requests BOTTLES DRAWN AEROBIC AND ANAEROBIC 6CC EACH  Final    Culture NO GROWTH 4 DAYS  Final   Report Status PENDING  Incomplete  Culture, blood (Routine x 2)     Status: None (Preliminary result)   Collection Time: 06/18/16  9:16 PM  Result Value Ref Range Status   Specimen Description BLOOD RIGHT HAND  Final   Special Requests BOTTLES DRAWN AEROBIC AND ANAEROBIC [facility] EACH  Final   Culture NO GROWTH 4 DAYS  Final   Report Status PENDING  Incomplete     Labs: Basic Metabolic Panel:  Recent Labs Lab 06/18/16 2110 06/19/16 0313 06/22/16 0608  NA 145 145 145  K 3.8 3.3* 3.4*  CL 105 109 110  CO2 33* 32 28  GLUCOSE 158* 122* 111*  BUN 37* 32* 17  CREATININE 1.10* 0.89 0.69  CALCIUM 8.4* 7.8* 7.6*   Liver Function Tests:  Recent Labs Lab 06/18/16 2110 06/19/16 0313  AST 66* 43*  ALT 64* 50  ALKPHOS 72 61  BILITOT 0.3 0.5  PROT 7.9 6.8  ALBUMIN 2.4* 2.1*   No results for input(s): LIPASE, AMYLASE in the last 168 hours. No results for input(s): AMMONIA in the  last 168 hours. CBC:  Recent Labs Lab 06/18/16 2110 06/19/16 0313  WBC 20.5* 16.7*  NEUTROABS 18.0* 14.4*  HGB 9.9* 9.3*  HCT 32.0* 29.9*  MCV 99.4 98.4  PLT 452* 337   Cardiac Enzymes: No results for input(s): CKTOTAL, CKMB, CKMBINDEX, TROPONINI in the last 168 hours. BNP: BNP (last 3 results) No results for input(s): BNP in the last 8760 hours.  ProBNP (last 3 results) No results for input(s): PROBNP in the last 8760 hours.  CBG:  Recent Labs Lab 06/19/16 0712 06/20/16 0739 06/21/16 0721 06/21/16 1145 06/22/16 0743  GLUCAP 123* 100* 103* 100* 117*       Signed:  HERNANDEZ ACOSTA,ESTELA  [facility] Pager: [phone] 06/22/2016, 2:11 PM

## 2016-06-22 NOTE — Progress Notes (Signed)
Pt discharged home today per Dr. Ardyth HarpsHernandez.  Pt's IV site D/C'd and WDL.  Pt's VSS.  Family provided with home medication list, discharge instructions and prescriptions.  Script also faxed to [facility].  Family updated regarding discharge plan.  Verbalized understanding.  Pt [facility] floor [facility] [place] [facility].  Hospice RN updated via phone.  Verbalized understanding.  Daughter, Para MarchJeanette, called and voicemail left.

## 2016-06-23 LAB — CULTURE, BLOOD (ROUTINE X 2)
CULTURE: NO GROWTH
Culture: NO GROWTH

## 2016-06-29 ENCOUNTER — Emergency Department (HOSPITAL_COMMUNITY)
Admission: EM | Admit: 2016-06-29 | Discharge: 2016-06-29 | Disposition: A | Discharge status: Home / Self Care | Payer: Medicare Other | Attending: Emergency Medicine | Admitting: Emergency Medicine

## 2016-06-29 ENCOUNTER — Encounter (HOSPITAL_COMMUNITY): Payer: Self-pay | Admitting: *Deleted

## 2016-06-29 DIAGNOSIS — R21 Rash and other nonspecific skin eruption: Secondary | ICD-10-CM | POA: Diagnosis not present

## 2016-06-29 DIAGNOSIS — Z79899 Other long term (current) drug therapy: Secondary | ICD-10-CM | POA: Diagnosis not present

## 2016-06-29 DIAGNOSIS — I10 Essential (primary) hypertension: Secondary | ICD-10-CM | POA: Diagnosis not present

## 2016-06-29 DIAGNOSIS — F0390 Unspecified dementia without behavioral disturbance: Secondary | ICD-10-CM | POA: Insufficient documentation

## 2016-06-29 MED ORDER — DIPHENHYDRAMINE HCL 50 MG/ML IJ SOLN
25.0000 mg | Freq: Once | INTRAMUSCULAR | Status: AC
  Administered 2016-06-29: 25 mg via INTRAMUSCULAR
  Filled 2016-06-29: qty 1

## 2016-06-29 MED ORDER — METHYLPREDNISOLONE SODIUM SUCC 125 MG IJ SOLR
125.0000 mg | Freq: Once | INTRAMUSCULAR | Status: AC
  Administered 2016-06-29: 125 mg via INTRAMUSCULAR
  Filled 2016-06-29: qty 2

## 2016-06-29 MED ORDER — FAMOTIDINE 40 MG/5ML PO SUSR: 40.0000 mg | Freq: Every day | ORAL | 0 refills | Status: AC

## 2016-06-29 MED ORDER — DIPHENHYDRAMINE HCL 12.5 MG/5ML PO SYRP: ORAL_SOLUTION | ORAL | 0 refills | Status: AC

## 2016-06-29 NOTE — ED Notes (Signed)
Dr. Kathlene NovemberParson is physician telling her to come here.

## 2016-06-29 NOTE — ED Triage Notes (Signed)
PATIENT HAS DNR AT BEDSIDE

## 2016-06-29 NOTE — ED Notes (Signed)
Spoke with hospice nurse. Hospice nurse states she will be back out at patient's house to do wound care for patient.

## 2016-06-29 NOTE — ED Triage Notes (Signed)
Gabrielle Hopkins comes in by EMS from home (she is under hospice care). She was brought to the hospital within the last week. She was started on morphine on Tuesday. Since then she has a full body rash with blisters noted. The morphine was stopped at unknown time. She was started on prednisone yesterday and on oxycodone 5 mg every 2 hours. Pt is alert but disoriented. She is at baseline.

## 2016-06-29 NOTE — ED Provider Notes (Signed)
[facility]EM[facility]ENCY DEPT Provider Note   CSN: [id number] Arrival date & time: 06/29/16  1213     History   Chief Complaint Chief Complaint  Patient presents with  . Rash    HPI Gabrielle Hopkins is a 82 y.o. female.  Patient brought over here from home with a rash. Patient has dementia and is on hospice and is comfort care only. She was started on morphine and developed a rash. She was then started on prednisone and the morphine was stopped and she is taking Percocet for pain   The history is provided by the EMS personnel. No language interpreter was used.  Rash   This is a new problem. The current episode started more than 2 days ago. The problem has not changed since onset.The problem is associated with nothing. There has been no fever. The rash is present on the torso, back, right upper leg, right lower leg, left upper leg and left lower leg. Pain scale: Unknown. The pain is mild. The pain has been constant since onset. Associated symptoms include blisters. Treatments tried: Prednisone.    Past Medical History:  Diagnosis Date  . Arthritis   . Blood transfusion without reported diagnosis   . Dementia   . Hypertension   . Lupus   . Osteoporosis     Patient Active Problem List   Diagnosis Date Noted  . Goals of care, counseling/discussion   . DNR (do not resuscitate)   . Palliative care encounter   . Sepsis (HCC) 06/19/2016  . Normocytic anemia 06/19/2016  . Decubitus ulcer of buttock, unstageable (HCC)   . Sepsis affecting skin (HCC) 06/18/2016  . Dementia 06/18/2016  . Sacral wound, initial encounter 06/18/2016  . Hypertension 06/18/2016  . Sacral decubitus ulcer 06/18/2016    Past Surgical History:  Procedure Laterality Date  . BACK SURGERY    . VASCULAR SURGERY      OB History    No data available       Home Medications    Prior to Admission medications   Medication Sig Start Date End Date Taking? Authorizing Provider  diphenhydrAMINE (BENYLIN) 12.5  MG/5ML syrup Take 2 teaspoons every 4-6 hours for rash 06/29/16   Bethann Berkshire, MD  donepezil (ARICEPT) 10 MG tablet Take 10 mg by mouth daily.    Historical Provider, MD  famotidine (PEPCID) 40 MG/5ML suspension Take 5 mLs (40 mg total) by mouth daily. 06/29/16   Bethann Berkshire, MD  memantine (NAMENDA) 10 MG tablet Take 10 mg by mouth 2 (two) times daily.    Historical Provider, MD  Morphine Sulfate (MORPHINE CONCENTRATE) 10 MG/0.5ML SOLN concentrated solution Place 0.13 mLs (2.6 mg total) under the tongue every 2 (two) hours as needed for moderate pain or severe pain. 06/22/16   Henderson Cloud, MD  mupirocin ointment (BACTROBAN) 2 % Apply 1 application topically 2 (two) times daily. Applied at dressing changes 06/15/16   Historical Provider, MD    Family History No family history on file.  Social History Social History  Substance Use Topics  . Smoking status: Never Smoker  . Smokeless tobacco: Never Used  . Alcohol use No     Allergies   Review of patient's allergies indicates no known allergies.   Review of Systems Review of Systems  Unable to perform ROS: Dementia  Skin: Positive for rash.     Physical Exam Updated Vital Signs BP 109/59   Pulse 85   Resp 18   SpO2 100%  Physical Exam  Constitutional: She appears well-developed.  HENT:  Head: Normocephalic.  Eyes: Conjunctivae are normal. No scleral icterus.  Neck: Neck supple. No thyromegaly present.  Cardiovascular: Normal rate and regular rhythm.  Exam reveals no gallop and no friction rub.   No murmur heard. Pulmonary/Chest: No stridor. She has no wheezes. She has no rales. She exhibits no tenderness.  Abdominal: She exhibits no distension. There is no tenderness. There is no rebound.  Musculoskeletal: She exhibits edema.  Lymphadenopathy:    She has no cervical adenopathy.  Neurological:  Patient responds only to painful stimuli  Skin: Rash noted. No erythema.  Patient has a maculopapular rash  on her chest back abdomen legs arm. Some blisters. Suspect this is allergic dermatitis     ED Treatments / Results  Labs (all labs ordered are listed, but only abnormal results are displayed) Labs Reviewed - No data to display  EKG  EKG Interpretation None     Patient with a rash that appears allergic. Patient will be put on Benadryl Pepcid along with the prednisone she is taking. Patient is comfort care only and DO NOT RESUSCITATE. Patient will be discharged home for continued hospice care  Radiology No results found.  Procedures Procedures (including critical care time)  Medications Ordered in ED Medications  methylPREDNISolone sodium succinate (SOLU-MEDROL) 125 mg/2 mL injection 125 mg (not administered)  diphenhydrAMINE (BENADRYL) injection 25 mg (not administered)     Initial Impression / Assessment and Plan / ED Course  I have reviewed the triage vital signs and the nursing notes.  Pertinent labs & imaging results that were available during my care of the patient were reviewed by me and considered in my medical decision making (see chart for details).  Clinical Course      Final Clinical Impressions(s) / ED Diagnoses   Final diagnoses:  Rash    New Prescriptions New Prescriptions   DIPHENHYDRAMINE (BENYLIN) 12.5 MG/5ML SYRUP    Take 2 teaspoons every 4-6 hours for rash   FAMOTIDINE (PEPCID) 40 MG/5ML SUSPENSION    Take 5 mLs (40 mg total) by mouth daily.     Bethann BerkshireJoseph Javed Cotto, MD 06/29/16 1336

## 2016-06-29 NOTE — Discharge Instructions (Signed)
Continue taking prednisone. Take the Benadryl every 4-6 hours and the Pepcid once a day. Call your doctor to see her Wednesday and let them know how you doing

## 2016-08-16 DEATH — deceased
